# Patient Record
Sex: Male | Born: 1950 | Race: White | Hispanic: No | Marital: Married | Smoking: Former smoker
Health system: Southern US, Community
[De-identification: ages and names within clinical notes are randomized; demographics above are authoritative.]

## PROBLEM LIST (undated history)

## (undated) DIAGNOSIS — M1990 Unspecified osteoarthritis, unspecified site: Secondary | ICD-10-CM

## (undated) DIAGNOSIS — K5792 Diverticulitis of intestine, part unspecified, without perforation or abscess without bleeding: Secondary | ICD-10-CM

## (undated) DIAGNOSIS — I739 Peripheral vascular disease, unspecified: Secondary | ICD-10-CM

## (undated) HISTORY — PX: LUMBAR FUSION: SHX111

## (undated) HISTORY — PX: FRACTURE SURGERY: SHX138

## (undated) HISTORY — PX: HERNIA REPAIR: SHX51

## (undated) HISTORY — PX: MENISCUS REPAIR: SHX5179

## (undated) HISTORY — PX: LUMBAR DISC SURGERY: SHX700

## (undated) HISTORY — PX: COLONOSCOPY: SHX174

---

## 2000-09-23 ENCOUNTER — Ambulatory Visit (HOSPITAL_COMMUNITY): Admission: RE | Admit: 2000-09-23 | Discharge: 2000-09-23 | Payer: Self-pay | Admitting: Neurosurgery

## 2000-09-23 ENCOUNTER — Encounter: Payer: Self-pay | Admitting: Neurosurgery

## 2000-10-05 ENCOUNTER — Encounter: Payer: Self-pay | Admitting: Neurosurgery

## 2000-10-05 ENCOUNTER — Inpatient Hospital Stay (HOSPITAL_COMMUNITY): Admission: RE | Admit: 2000-10-05 | Discharge: 2000-10-07 | Payer: Self-pay | Admitting: Neurosurgery

## 2000-10-23 ENCOUNTER — Encounter: Admission: RE | Admit: 2000-10-23 | Discharge: 2000-10-23 | Payer: Self-pay | Admitting: Neurosurgery

## 2000-10-23 ENCOUNTER — Encounter: Payer: Self-pay | Admitting: Neurosurgery

## 2003-12-22 ENCOUNTER — Emergency Department (HOSPITAL_COMMUNITY): Admission: EM | Admit: 2003-12-22 | Discharge: 2003-12-22 | Payer: Self-pay | Admitting: Emergency Medicine

## 2004-01-02 ENCOUNTER — Emergency Department (HOSPITAL_COMMUNITY): Admission: EM | Admit: 2004-01-02 | Discharge: 2004-01-02 | Payer: Self-pay | Admitting: *Deleted

## 2004-06-10 ENCOUNTER — Ambulatory Visit: Payer: Self-pay | Admitting: Family Medicine

## 2004-06-13 ENCOUNTER — Encounter: Admission: RE | Admit: 2004-06-13 | Discharge: 2004-06-13 | Payer: Self-pay | Admitting: Family Medicine

## 2004-06-17 ENCOUNTER — Ambulatory Visit: Payer: Self-pay | Admitting: Family Medicine

## 2005-12-13 ENCOUNTER — Encounter: Admission: RE | Admit: 2005-12-13 | Discharge: 2005-12-13 | Payer: Self-pay | Admitting: Neurosurgery

## 2005-12-22 ENCOUNTER — Encounter: Admission: RE | Admit: 2005-12-22 | Discharge: 2005-12-22 | Payer: Self-pay | Admitting: Neurosurgery

## 2006-01-12 ENCOUNTER — Encounter: Admission: RE | Admit: 2006-01-12 | Discharge: 2006-01-12 | Payer: Self-pay | Admitting: Neurosurgery

## 2008-02-12 ENCOUNTER — Encounter: Admission: RE | Admit: 2008-02-12 | Discharge: 2008-02-12 | Payer: Self-pay | Admitting: Neurosurgery

## 2008-04-22 ENCOUNTER — Inpatient Hospital Stay (HOSPITAL_COMMUNITY): Admission: RE | Admit: 2008-04-22 | Discharge: 2008-04-22 | Payer: Self-pay | Admitting: Neurosurgery

## 2010-08-21 ENCOUNTER — Encounter: Payer: Self-pay | Admitting: Neurosurgery

## 2010-12-13 NOTE — H&P (Signed)
Joseph Melton, BIGGAR NO.:  [id number]   MEDICAL RECORD NO.:  [id number]          PATIENT TYPE:  INP   LOCATION:  3172                         FACILITY:  [facility]   PHYSICIAN:  Payton Doughty, M.D.      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   This is a 60 year old right-handed white gentleman who has had several  fusions in his back, had spondylolysis, and then was decompressed by Dr.  Roxan Hockey numerous years ago, developed increased slip.  I had fused him  6 or 7 years ago from L3-S1, been doing reasonably well, and over the  past few months developed increased pain in his back and down his leg.  Films show stenosis at L2-3 with some transitional segment disease.  He  tried an epidural steroid injection, did not help him very much.  He now  presents for a simple decompression, L2-3.   Medical history is very benign.   He stopped smoking, does not drink.  He is an Nutritional therapist.  The  only operations have been his back.   MEDICATIONS:  Occasional ibuprofen.   Allergies are none.   Family history is not germane.   REVIEW OF SYSTEMS:  Marked for back and leg pain.   PHYSICAL EXAMINATION:  HEENT:  Normal limits.  NECK:  He has reasonable range of motion in his neck.  CHEST:  Clear.  CARDIAC:  Regular rate and rhythm.  ABDOMEN:  Nontender.  No hepatosplenomegaly.  EXTREMITIES:  Without clubbing or cyanosis.  Peripheral pulses are good.  GU:  Deferred.  NEUROLOGIC:  He is awake, alert, and oriented.  Cranial nerves are  intact.  Motor exam shows 5/5 strength throughout the upper and lower  extremities.  The biggest problem is when he stands up, he develops pain  down both legs and his legs get heavy after he has been up for a bit.  Reflexes are absent in the knees and ankles.  Toes are downgoing  bilaterally.  Straight  leg is negative.   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   The plan is for bilateral laminotomy and foraminotomy.  This is in lieu  of extending his fusion.  The risks and benefits of this have been  discussed with him and he wished to proceed.           ______________________________  Payton Doughty, M.D.     MWR/MEDQ  D:  04/22/2008  T:  04/22/2008  Job:  [id number]

## 2010-12-13 NOTE — Op Note (Signed)
NAMEORMAN, MATSUMURA NO.:  [id number]   MEDICAL RECORD NO.:  [id number]          PATIENT TYPE:  INP   LOCATION:  NA                           FACILITY:  [facility]   PHYSICIAN:  Payton Doughty, M.D.      DATE OF BIRTH:  12/07/1950   DATE OF PROCEDURE:  04/22/2008  DATE OF DISCHARGE:                               OPERATIVE REPORT   PREOPERATIVE DIAGNOSIS:  Spondylosis L2-L3.   POSTOPERATIVE DIAGNOSIS:  Spondylosis L2-L3.   PROCEDURE:  L2-L3 laminotomy and foraminotomy done bilaterally.   DICTATING DOCTOR:  Payton Doughty, MD   ANESTHESIA:  General endotracheal.   PREPARATION:  Prepped and draped with alcohol wipe.   COMPLICATIONS:  None.   BODY OF TEXT:  This is a 60 year old gentleman with fusion from L3-S1,  now with spondylosis at L2-L3, taken to the operating room, smoothly  anesthetized and intubated, placed prone on the operating table.  Following shave, prep, and drape in the usual sterile fashion, skin was  infiltrated with 1% lidocaine with 1:400,000 epinephrine.  The skin was  incised over the lamina of L2.  It was dissected free in the  subperiosteal plane. Intraoperative x-ray confirmed correctness level.  Having confirmed correctness level, laminotomy and foraminotomy was  carried to the top of the ligamentum flavum that was removed in a  retrograde fashion.  Great care was taken to not violate the pars  radicularis and cause destabilization.  Decompression was carried out  bilaterally.  Both the L2 and L3 roots were visualized and explored out  of the neuroforamen.  Following complete decompression, the wound was  irrigated, and hemostasis assured.  Depo-Medrol-soaked pad was placed in  the laminotomy defect.  Successive layers of 0-Vicryl, 2-0 Vicryl, and 3-  0 nylon were used to close.  Betadine and Telfa dressing was applied and  made occlusive with OpSite.  The patient returned to the recovery room  in good condition.     ______________________________  Payton Doughty, M.D.     MWR/MEDQ  D:  04/22/2008  T:  04/23/2008  Job:  [id number]

## 2010-12-16 NOTE — Op Note (Signed)
[place]  Patient:    Joseph Melton, Joseph Melton                     MRN: [id number] Proc. Date: 10/05/00 Adm. 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  PROCEDURES:  L3-4, L4-5 laminectomy, diskectomy, and posterior lumbar interbody fusion with the Ray Threaded Fusion Cage, and L5-S1 Gill procedure with L5-S1 pedicle fixation with spiral 90D pedicle screws and collagen bone marrow aspirate bone grafting.  SURGEON:  Payton Doughty, M.D.  ANESTHESIA:  General endotracheal.  PREPARATION:  Sterile Betadine prep and scrub with alcohol wipe.  COMPLICATIONS:  None.  CLINICAL NOTE:  A 60 year old gentleman with severe spondylitic disease, spondylolysis at L5, with a grade 1 slip of L5 on S1 and recurrent disk at L3-4 on the right.  DESCRIPTION OF PROCEDURE:  Patient taken to the operating room and smoothly incised and intubated, placed prone on the operating table.  Following shave, prep, and drape in the usual sterile fashion, the skin was infiltrated with 1% lidocaine with 1:400,000 epinephrine.  Skin was incised from mid-S1 to mid-L2, and the laminae of L3, L4, L5, and S1 were exposed bilaterally in the subperiosteal plane, and the transverse process of L5 and the sacral ala were exposed bilaterally.  Intraoperative x-ray confirmed correctness of level. The lamina, pars interarticularis, and inferior facets of L3 and L4 and the superior facet of L4 and L5 were removed bilaterally.  On the right side, there was a recurrent herniated disk at 3-4 that was removed without difficulty.  The 3, 4, and 5 roots were dissected free of their _____  of respective pedicles.  Ligamentum flavum was removed.  Diskectomy was carried out at each level, and Ray Threaded Fusion Cages were placed, 14 x 26 mm cages at 4-5 and 12 x 26 mm cages at 3-4.  Intraoperative x-ray confirmed that we had good placement of the cages.  They were packed with bone graft harvested from the facet joints and capped.  The wound was irrigated and hemostasis assured.  The lamina and inferior facet of L5 was then removed as two pieces, leaving the interspinous ligament and spinous process as a tension band.  The L5 root was then demonstrated coming off in a conjoined fashion on the right side.  It was extremely stretched across the spondylolisthesis at L5 on S1. Because of this anatomy, it was not possible to place Ray cages in an interbody fashion at 5-1.  The nerve itself was quite stretched, very thin, and looked quite unhealthy.  Similar findings were manifest on the left side, except the nerve appeared to be a bit more robust, although it was quite stretched.  The pedicle screws were then placed using standard trajectory at both L5 and in the S1.  Intraoperative x-ray showed good placement of pedicle screws.  Forty millimeter rods were then placed across and then locked into place.  Bone marrow was aspirated from the sacrum through the pedicle hole and also through L5 through the pedicle hole and placed on the collagen matrix.  This was placed in the intertransverse position as well as between the pedicles of L5 and S1.  These had previously been decorticated with the high-speed drill.  This was augmented with bone that was taken from the facet joints and tamped into place.  The fascia was then reapproximated with 0 Vicryl in interrupted fashion, subcutaneous tissue was reapproximated with 0 Vicryl in interrupted fashion, subcuticular tissue was reapproximated with 3-0 Vicryl in interrupted fashion.  The skin was closed with 3-0 nylon in a running locked fashion.   Betadine and Telfa dressing was applied and made occlusive with OpSite.  The patient was then returned to the recovery room in good condition. DD:  10/05/00 TD:  10/06/00 Job: [id number]

## 2010-12-16 NOTE — H&P (Signed)
. [facility]  Patient:    Joseph Melton, Joseph Melton                       MRN: [id number] Adm. Date:  10/05/00 Attending:  Payton Doughty, M.D.                         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  HISTORY OF PRESENT ILLNESS:  This is a now 60 year old right-handed white gentleman who in 1995 underwent a herniated disc at L3-4 on the right side by Dr. Corlis Hove.  He did well postoperatively.  At that time he was noted to have spondylolysis and mild spondylolisthesis of L5 on S1.  Starting about two months ago, he had had increasing pain in his back and now toward his right hip, much worse over the past couple of weeks.  He has discomfort down into his shin and cannot get comfortable in any position.  MRI plane films revealed increased lipid L5-S1 as well as scarring of what I believe to be recurrent disc at L3-4 on the right side.  He is now admitted for a laminectomy discectomy infusion at L3-4, L4-5 and L5-S1.  MEDICAL HISTORY:  Is really unremarkable.  MEDICATIONS:  He takes no medications.  OPERATIONS:  His only operations have been his back operation.  ALLERGIES:  He has no allergies.  FAMILY HISTORY:  Mom is 35 and in good health.  Dad is 52 and in good health. They both had lumbar spine operations.  SOCIAL HISTORY:  Smokes a pack of cigarettes occasionally, drinks alcohol socially and is remodeling a golf course.  REVIEW OF SYSTEMS:  Remarkable for back pain and leg pain, does not have any other problems or any other complaints and there are no bladder complaints. HEENT exam was within normal limits.  He has good range of motion in his neck. Chest is clear.  Cardiac exam is regular rate and rhythm without murmur. Abdomen is nontender with no hepatosplenomegaly.  Extremities without clubbing or cyanosis.  GU  exam was deferred.  Peripheral pulses are good. Neurologically he is awake, alert and oriented.  Pupils are equal, round and reactive to light.  His extraocular movements are intact.  Facial movement and sensation are intact.  Tongue is in the midline and he describes no swallowing difficulty.  Shoulder shrug is normal.  Motor exam is 5/5 strength throughout the upper and lower extremities and there is no current sensory deficit.  In the upper extremities and lower extremities, he has numbness on the soles of both feet.  Straight leg raise was not positive previously, is now positive on the right side.  Deep tendon reflexes are 2 at the knees, absent at the ankles bilaterally.  He can bend forward with his back to about 10 degrees before it hurts then stops for awhile, then hurts again at about 90 degrees and is uncomfortable all the way through recovery ______ and into extension.  He comes in with plane films that show spondylolysis of L5 with a grade 2 ______ of L5-S1.  MRI confirms these findings, demonstrates a pseudo disc at 5-1 with biforaminal narrowing compressing the 5 ridge, severe spondylosis at 4-5 and probable recurrent disc at 3-4 on the right side.  CLINICAL IMPRESSION:  Spondylolysis of L5 with grade 1 spondylolisthesis of  L5 and S1, spondylosis of L4-5 and recurrent herniated disc at L3-4.  The plan is for a lumbar laminectomy, discectomy, posterior lumbar by fusion at 3-4, 4-5, and 5-1. I think 3-4 needs to be done and 5-1 certainly needs to be done and cannot leave 4-5 as a floating level.  The risks and benefits of this approach have been discussed with him and he wishes to proceed. DD:  10/05/00 TD:  10/05/00 Job: [id number]

## 2011-03-30 ENCOUNTER — Other Ambulatory Visit: Payer: Self-pay | Admitting: Internal Medicine

## 2011-03-30 DIAGNOSIS — K5792 Diverticulitis of intestine, part unspecified, without perforation or abscess without bleeding: Secondary | ICD-10-CM

## 2011-03-30 DIAGNOSIS — R1032 Left lower quadrant pain: Secondary | ICD-10-CM

## 2011-03-31 ENCOUNTER — Other Ambulatory Visit: Payer: Self-pay

## 2011-05-01 LAB — DIFFERENTIAL
Basophils Relative: 1
Eosinophils Relative: 2
Monocytes Absolute: 1.1 — ABNORMAL HIGH
Neutro Abs: 5.7

## 2011-05-01 LAB — URINALYSIS, ROUTINE W REFLEX MICROSCOPIC
Glucose, UA: NEGATIVE
Nitrite: NEGATIVE
Urobilinogen, UA: 0.2
pH: 5

## 2011-05-01 LAB — COMPREHENSIVE METABOLIC PANEL
ALT: 15
Alkaline Phosphatase: 63
CO2: 25
Calcium: 9.6
GFR calc Af Amer: >60
Glucose, Bld: 104 — ABNORMAL HIGH
Potassium: 4.5
Sodium: 140
Total Bilirubin: 0.8
Total Protein: 6.6

## 2011-05-01 LAB — PROTIME-INR: Prothrombin Time: 12.4

## 2011-05-01 LAB — CBC
Hemoglobin: 16.3
MCHC: 33.9
RBC: 5.2
RDW: 13.8
WBC: 9.7

## 2011-05-01 LAB — URINE MICROSCOPIC-ADD ON

## 2011-05-01 LAB — APTT: aPTT: 31

## 2012-06-05 ENCOUNTER — Other Ambulatory Visit: Payer: Self-pay | Admitting: Family Medicine

## 2012-06-05 DIAGNOSIS — D72829 Elevated white blood cell count, unspecified: Secondary | ICD-10-CM

## 2012-06-05 DIAGNOSIS — R1032 Left lower quadrant pain: Secondary | ICD-10-CM

## 2012-06-05 DIAGNOSIS — R319 Hematuria, unspecified: Secondary | ICD-10-CM

## 2012-06-07 ENCOUNTER — Ambulatory Visit
Admission: RE | Admit: 2012-06-07 | Discharge: 2012-06-07 | Disposition: A | Discharge status: Home / Self Care | Payer: BC Managed Care – PPO | Source: Ambulatory Visit | Attending: Family Medicine | Admitting: Family Medicine

## 2012-06-07 DIAGNOSIS — R319 Hematuria, unspecified: Secondary | ICD-10-CM

## 2012-06-07 DIAGNOSIS — D72829 Elevated white blood cell count, unspecified: Secondary | ICD-10-CM

## 2012-06-07 DIAGNOSIS — R1032 Left lower quadrant pain: Secondary | ICD-10-CM

## 2012-06-07 MED ORDER — IOHEXOL 300 MG/ML  SOLN
100.0000 mL | Freq: Once | INTRAMUSCULAR | Status: AC | PRN
  Administered 2012-06-07: 100 mL via INTRAVENOUS

## 2013-09-22 ENCOUNTER — Ambulatory Visit (INDEPENDENT_AMBULATORY_CARE_PROVIDER_SITE_OTHER): Payer: BC Managed Care – PPO | Admitting: Physician Assistant

## 2013-09-22 ENCOUNTER — Encounter: Payer: Self-pay | Admitting: Physician Assistant

## 2013-09-22 VITALS — BP 122/86 | HR 68 | Temp 98.0°F | Resp 18 | Ht 68.5 in | Wt 207.0 lb

## 2013-09-22 DIAGNOSIS — H8110 Benign paroxysmal vertigo, unspecified ear: Secondary | ICD-10-CM

## 2013-09-22 MED ORDER — MECLIZINE HCL 25 MG PO TABS: 25.0000 mg | ORAL_TABLET | Freq: Three times a day (TID) | ORAL | Status: DC | PRN

## 2013-09-22 NOTE — Progress Notes (Signed)
    Patient ID: Joseph Melton MRN: [id number], DOB: 12/05/1950, 63 y.o. Date of Encounter: 09/22/2013, 1:39 PM    Chief Complaint:  Chief Complaint  Patient presents with  . dizzy    x 3 days     HPI: 63 y.o. year old white male reports that he has no significant past medical history other than diverticulitis. Takes no medication on a daily basis except for Zantac.  Says that he has been having these symptoms for the past 3 days. Whenever he has a change in position or sits down or first stands up, he has a spinning sensation. When he walks he feels like he is drunk. Says that whenever he initially has a position change is when he notices the  spinning but once he stays in a certain position the spinning stops.. Says this has never  happened to him before. Has had no recent viral illness or cold like symptoms.     Home Meds: See attached medication section for any medications that were entered at today's visit. The computer does not put those onto this list.The following list is a list of meds entered prior to today's visit.   No current outpatient prescriptions on file prior to visit.   No current facility-administered medications on file prior to visit.    Allergies: No Known Allergies    Review of Systems: See HPI for pertinent ROS. All other ROS negative.    Physical Exam: Blood pressure 122/86, pulse 68, temperature 98 F (36.7 C), temperature source Oral, resp. rate 18, height 5' 8.5" (1.74 m), weight 207 lb (93.895 kg)., Body mass index is 31.01 kg/(m^2). General: WNWD WM.  Appears in no acute distress. HEENT: Normocephalic, atraumatic, eyes without discharge, sclera non-icteric, nares are without discharge. Bilateral auditory canals clear, TM's are without perforation, pearly grey and translucent with reflective cone of light bilaterally. Neck: Supple. No thyromegaly. No lymphadenopathy. Lungs: Clear bilaterally to auscultation without wheezes, rales, or rhonchi.  Breathing is unlabored. Heart: Regular rhythm. No murmurs, rubs, or gallops. Dix-Hallpike maneuver: Positive. When  turned his head to each side, he develops vertigo when turned to both the right and the left. Msk:  Strength and tone normal for age. Neuro: Alert and oriented X 3. Moves all extremities spontaneously. Gait is normal. CNII-XII grossly in tact. Psych:  Responds to questions appropriately with a normal affect.     ASSESSMENT AND PLAN:  63 y.o. year old male with  1. Benign paroxysmal positional vertigo - meclizine (ANTIVERT) 25 MG tablet; Take 1 tablet (25 mg total) by mouth 3 (three) times daily as needed for dizziness.  Dispense: 30 tablet; Refill: 0 - Ambulatory referral to ENT He states that one week from today he leaves to go out of town for work and will be gone for one month. I discussed this with our staff to try to get he and into the ENT this week. ENT can do Epley maneuvers and teach him how to do these. As well I went ahead and printed out Epley maneuvers for him so that he can start doing these at home as well.   [address]igned, Mary Beth CobreDixon, [place], [facility] 09/22/2013 1:39 PM

## 2017-10-29 ENCOUNTER — Other Ambulatory Visit: Payer: Self-pay | Admitting: Orthopedic Surgery

## 2017-10-30 NOTE — Pre-Procedure Instructions (Signed)
Joseph ScarletRodney R Melton  10/30/2017    Your procedure is scheduled on Tuesday, November 06, 2017 at 7:30 AM.   [facility] at 5:30 AM.   Call this number if you have problems the morning of surgery: [phone]   Questions prior to day of surgery, please call [phone] between 8 & 4 PM.   Remember:  Do not eat food or drink liquids after midnight Monday, 11/05/17.   Stop NSAIDS (Ibuprofen, Aleve, etc) as of today. Do not use Aspirin Products prior to surgery.   Do not wear jewelry.  Do not wear lotions, powders, cologne or deodorant.  Men may shave face and neck.  Do not bring valuables to the hospital.  [facility] is not responsible for any belongings or valuables.  Contacts, dentures or bridgework may not be worn into surgery.  Leave your suitcase in the car.  After surgery it may be brought to your room.  For patients admitted to the hospital, discharge time will be determined by your treatment team.  Patients discharged the day of surgery will not be allowed to drive home.   [place] - Preparing for Surgery  Before surgery, you can play an important role.  Because skin is not sterile, your skin needs to be as free of germs as possible.  You can reduce the number of germs on you skin by washing with CHG (chlorahexidine gluconate) soap before surgery.  CHG is an antiseptic cleaner which kills germs and bonds with the skin to continue killing germs even after washing.  Please DO NOT use if you have an allergy to CHG or antibacterial soaps.  If your skin becomes reddened/irritated stop using the CHG and inform your nurse when you arrive at Short Stay.  Do not shave (including legs and underarms) for at least 48 hours prior to the first CHG shower.  You may shave your face.  Please follow these instructions carefully:   1.  Shower with CHG Soap the night before surgery and the                    morning of Surgery.  2.  If you choose  to wash your hair, wash your hair first as usual with your       normal shampoo.  3.  After you shampoo, rinse your hair and body thoroughly to remove the shampoo.  4.  Use CHG as you would any other liquid soap.  You can apply chg directly       to the skin and wash gently with scrungie or a clean washcloth.  5.  Apply the CHG Soap to your body ONLY FROM THE NECK DOWN.        Do not use on open wounds or open sores.  Avoid contact with your eyes, ears, mouth and genitals (private parts).  Wash genitals (private parts) with your normal soap.  6.  Wash thoroughly, paying special attention to the area where your surgery        will be performed.  7.  Thoroughly rinse your body with warm water from the neck down.  8.  DO NOT shower/wash with your normal soap after using and rinsing off       the CHG Soap.  9.  Pat yourself dry with a clean towel.            10.  Wear clean pajamas.  11.  Place clean sheets on your bed the night of your first shower and do not        sleep with pets.  Day of Surgery  Do not apply any lotions/deodorants the morning of surgery.  Please wear clean clothes to the hospital.   Please read over the fact sheets that you were given.

## 2017-10-31 ENCOUNTER — Other Ambulatory Visit: Payer: Self-pay

## 2017-10-31 ENCOUNTER — Encounter (HOSPITAL_COMMUNITY)
Admission: RE | Admit: 2017-10-31 | Discharge: 2017-10-31 | Disposition: A | Discharge status: Home / Self Care | Payer: Medicare Other | Source: Ambulatory Visit | Attending: Obstetrics & Gynecology | Admitting: Obstetrics & Gynecology

## 2017-10-31 ENCOUNTER — Encounter (HOSPITAL_COMMUNITY): Payer: Self-pay

## 2017-10-31 DIAGNOSIS — Z01812 Encounter for preprocedural laboratory examination: Secondary | ICD-10-CM | POA: Diagnosis not present

## 2017-10-31 HISTORY — DX: Unspecified osteoarthritis, unspecified site: M19.90

## 2017-10-31 HISTORY — DX: Diverticulitis of intestine, part unspecified, without perforation or abscess without bleeding: K57.92

## 2017-10-31 LAB — CBC
HCT: 45.3 % (ref 39.0–52.0)
Hemoglobin: 14.6 g/dL (ref 13.0–17.0)
MCH: 30.2 pg (ref 26.0–34.0)
MCHC: 32.2 g/dL (ref 30.0–36.0)
MCV: 93.6 fL (ref 78.0–100.0)
Platelets: 201 10*3/uL (ref 150–400)
RBC: 4.84 MIL/uL (ref 4.22–5.81)
RDW: 13.7 % (ref 11.5–15.5)
WBC: 7.7 10*3/uL (ref 4.0–10.5)

## 2017-10-31 NOTE — Progress Notes (Signed)
Pt denies cardiac history, HTN, or diabetes.

## 2017-11-06 ENCOUNTER — Ambulatory Visit (HOSPITAL_COMMUNITY): Payer: Medicare Other | Admitting: Emergency Medicine

## 2017-11-06 ENCOUNTER — Encounter (HOSPITAL_COMMUNITY)
Admission: RE | Disposition: A | Discharge status: Home / Self Care | Payer: Self-pay | Source: Ambulatory Visit | Attending: Orthopedic Surgery

## 2017-11-06 ENCOUNTER — Encounter (HOSPITAL_COMMUNITY): Payer: Self-pay | Admitting: *Deleted

## 2017-11-06 ENCOUNTER — Ambulatory Visit (HOSPITAL_COMMUNITY)
Admission: RE | Admit: 2017-11-06 | Discharge: 2017-11-06 | Disposition: A | Discharge status: Home / Self Care | Payer: Medicare Other | Source: Ambulatory Visit | Attending: Orthopedic Surgery | Admitting: Orthopedic Surgery

## 2017-11-06 DIAGNOSIS — Z981 Arthrodesis status: Secondary | ICD-10-CM | POA: Insufficient documentation

## 2017-11-06 DIAGNOSIS — W1830XA Fall on same level, unspecified, initial encounter: Secondary | ICD-10-CM | POA: Diagnosis not present

## 2017-11-06 DIAGNOSIS — Z87891 Personal history of nicotine dependence: Secondary | ICD-10-CM | POA: Diagnosis not present

## 2017-11-06 DIAGNOSIS — Z791 Long term (current) use of non-steroidal anti-inflammatories (NSAID): Secondary | ICD-10-CM | POA: Diagnosis not present

## 2017-11-06 DIAGNOSIS — S46012A Strain of muscle(s) and tendon(s) of the rotator cuff of left shoulder, initial encounter: Secondary | ICD-10-CM | POA: Diagnosis not present

## 2017-11-06 HISTORY — PX: SHOULDER ARTHROSCOPY WITH ROTATOR CUFF REPAIR AND SUBACROMIAL DECOMPRESSION: SHX5686

## 2017-11-06 SURGERY — SHOULDER ARTHROSCOPY WITH ROTATOR CUFF REPAIR AND SUBACROMIAL DECOMPRESSION
Anesthesia: General | Site: Shoulder | Laterality: Left

## 2017-11-06 MED ORDER — PHENYLEPHRINE HCL 10 MG/ML IJ SOLN
INTRAMUSCULAR | Status: DC | PRN
  Administered 2017-11-06: 25 ug/min via INTRAVENOUS

## 2017-11-06 MED ORDER — PROPOFOL 10 MG/ML IV BOLUS
INTRAVENOUS | Status: DC | PRN
  Administered 2017-11-06: 150 mg via INTRAVENOUS

## 2017-11-06 MED ORDER — FENTANYL CITRATE (PF) 250 MCG/5ML IJ SOLN
INTRAMUSCULAR | Status: AC
  Filled 2017-11-06: qty 5

## 2017-11-06 MED ORDER — ROCURONIUM BROMIDE 100 MG/10ML IV SOLN
INTRAVENOUS | Status: DC | PRN
  Administered 2017-11-06: 50 mg via INTRAVENOUS

## 2017-11-06 MED ORDER — SUGAMMADEX SODIUM 200 MG/2ML IV SOLN
INTRAVENOUS | Status: AC
  Filled 2017-11-06: qty 2

## 2017-11-06 MED ORDER — LIDOCAINE 2% (20 MG/ML) 5 ML SYRINGE
INTRAMUSCULAR | Status: AC
  Filled 2017-11-06: qty 5

## 2017-11-06 MED ORDER — SODIUM CHLORIDE 0.9 % IR SOLN
Status: DC | PRN
  Administered 2017-11-06: 3000 mL

## 2017-11-06 MED ORDER — STERILE WATER FOR IRRIGATION IR SOLN
Status: DC | PRN
  Administered 2017-11-06: 1000 mL

## 2017-11-06 MED ORDER — CEFAZOLIN SODIUM-DEXTROSE 2-4 GM/100ML-% IV SOLN
2.0000 g | INTRAVENOUS | Status: AC
  Administered 2017-11-06: 2 g via INTRAVENOUS
  Filled 2017-11-06: qty 100

## 2017-11-06 MED ORDER — OXYCODONE-ACETAMINOPHEN 5-325 MG PO TABS: 1.0000 | ORAL_TABLET | ORAL | 0 refills | Status: DC | PRN

## 2017-11-06 MED ORDER — ONDANSETRON HCL 4 MG/2ML IJ SOLN
INTRAMUSCULAR | Status: DC | PRN
  Administered 2017-11-06: 4 mg via INTRAVENOUS

## 2017-11-06 MED ORDER — ONDANSETRON HCL 4 MG/2ML IJ SOLN: 4.0000 mg | Freq: Four times a day (QID) | INTRAMUSCULAR | Status: DC | PRN

## 2017-11-06 MED ORDER — DEXAMETHASONE SODIUM PHOSPHATE 10 MG/ML IJ SOLN
INTRAMUSCULAR | Status: DC | PRN
  Administered 2017-11-06: 10 mg via INTRAVENOUS

## 2017-11-06 MED ORDER — FENTANYL CITRATE (PF) 100 MCG/2ML IJ SOLN
INTRAMUSCULAR | Status: DC | PRN
  Administered 2017-11-06 (×2): 50 ug via INTRAVENOUS

## 2017-11-06 MED ORDER — BUPIVACAINE HCL (PF) 0.5 % IJ SOLN
INTRAMUSCULAR | Status: DC | PRN
  Administered 2017-11-06: 15 mL via PERINEURAL

## 2017-11-06 MED ORDER — ROCURONIUM BROMIDE 10 MG/ML (PF) SYRINGE
PREFILLED_SYRINGE | INTRAVENOUS | Status: AC
  Filled 2017-11-06: qty 5

## 2017-11-06 MED ORDER — MIDAZOLAM HCL 5 MG/5ML IJ SOLN
INTRAMUSCULAR | Status: DC | PRN
  Administered 2017-11-06: 2 mg via INTRAVENOUS

## 2017-11-06 MED ORDER — DEXAMETHASONE SODIUM PHOSPHATE 10 MG/ML IJ SOLN
INTRAMUSCULAR | Status: AC
  Filled 2017-11-06: qty 1

## 2017-11-06 MED ORDER — SUGAMMADEX SODIUM 200 MG/2ML IV SOLN
INTRAVENOUS | Status: DC | PRN
  Administered 2017-11-06: 200 mg via INTRAVENOUS

## 2017-11-06 MED ORDER — ONDANSETRON HCL 4 MG/2ML IJ SOLN
INTRAMUSCULAR | Status: AC
  Filled 2017-11-06: qty 2

## 2017-11-06 MED ORDER — MIDAZOLAM HCL 2 MG/2ML IJ SOLN
INTRAMUSCULAR | Status: AC
  Filled 2017-11-06: qty 2

## 2017-11-06 MED ORDER — LIDOCAINE HCL (CARDIAC) 20 MG/ML IV SOLN
INTRAVENOUS | Status: DC | PRN
  Administered 2017-11-06: 50 mg via INTRAVENOUS

## 2017-11-06 MED ORDER — FENTANYL CITRATE (PF) 100 MCG/2ML IJ SOLN: 25.0000 ug | INTRAMUSCULAR | Status: DC | PRN

## 2017-11-06 MED ORDER — METHOCARBAMOL 500 MG PO TABS: 500.0000 mg | ORAL_TABLET | Freq: Three times a day (TID) | ORAL | 0 refills | Status: DC

## 2017-11-06 MED ORDER — LACTATED RINGERS IV SOLN
INTRAVENOUS | Status: DC | PRN
  Administered 2017-11-06: 07:00:00 via INTRAVENOUS

## 2017-11-06 MED ORDER — OXYCODONE HCL 5 MG/5ML PO SOLN: 5.0000 mg | Freq: Once | ORAL | Status: DC | PRN

## 2017-11-06 MED ORDER — BUPIVACAINE LIPOSOME 1.3 % IJ SUSP
INTRAMUSCULAR | Status: DC | PRN
  Administered 2017-11-06: 10 mL via PERINEURAL

## 2017-11-06 MED ORDER — POVIDONE-IODINE 7.5 % EX SOLN
Freq: Once | CUTANEOUS | Status: DC
  Filled 2017-11-06: qty 118

## 2017-11-06 MED ORDER — PROPOFOL 10 MG/ML IV BOLUS
INTRAVENOUS | Status: AC
  Filled 2017-11-06: qty 20

## 2017-11-06 MED ORDER — OXYCODONE HCL 5 MG PO TABS: 5.0000 mg | ORAL_TABLET | Freq: Once | ORAL | Status: DC | PRN

## 2017-11-06 SURGICAL SUPPLY — 66 items
ANCH SUT SWLK 19.1X4.75 VT (Anchor) ×4 IMPLANT
ANCHOR PEEK 4.75X19.1 SWLK C (Anchor) ×8 IMPLANT
BLADE SURG 11 STRL SS (BLADE) ×3 IMPLANT
BUR OVAL 4.0 (BURR) ×3 IMPLANT
CANNULA 5.75X71 LONG (CANNULA) ×3 IMPLANT
CANNULA TWIST IN 8.25X7CM (CANNULA) IMPLANT
CHLORAPREP W/TINT 26ML (MISCELLANEOUS) ×3 IMPLANT
DRAPE INCISE IOBAN 66X45 STRL (DRAPES) ×3 IMPLANT
DRAPE ORTHO SPLIT 77X108 STRL (DRAPES) ×6
DRAPE STERI 35X30 U-POUCH (DRAPES) ×3 IMPLANT
DRAPE SURG 17X23 STRL (DRAPES) ×3 IMPLANT
DRAPE SURG ORHT 6 SPLT 77X108 (DRAPES) ×2 IMPLANT
DRAPE U-SHAPE 47X51 STRL (DRAPES) ×3 IMPLANT
DRAPE U-SHAPE 76X120 STRL (DRAPES) ×3 IMPLANT
DRSG PAD ABDOMINAL 8X10 ST (GAUZE/BANDAGES/DRESSINGS) ×6 IMPLANT
GAUZE SPONGE 4X4 12PLY STRL (GAUZE/BANDAGES/DRESSINGS) ×3 IMPLANT
GAUZE XEROFORM 1X8 LF (GAUZE/BANDAGES/DRESSINGS) ×3 IMPLANT
GLOVE BIO SURGEON STRL SZ7 (GLOVE) ×6 IMPLANT
GLOVE BIO SURGEON STRL SZ7.5 (GLOVE) ×3 IMPLANT
GLOVE BIOGEL PI IND STRL 7.0 (GLOVE) ×2 IMPLANT
GLOVE BIOGEL PI IND STRL 8 (GLOVE) ×1 IMPLANT
GLOVE BIOGEL PI INDICATOR 7.0 (GLOVE) ×4
GLOVE BIOGEL PI INDICATOR 8 (GLOVE) ×2
GOWN STRL REUS W/ TWL LRG LVL3 (GOWN DISPOSABLE) ×3 IMPLANT
GOWN STRL REUS W/ TWL XL LVL3 (GOWN DISPOSABLE) ×1 IMPLANT
GOWN STRL REUS W/TWL LRG LVL3 (GOWN DISPOSABLE) ×9
GOWN STRL REUS W/TWL XL LVL3 (GOWN DISPOSABLE) ×3
KIT BASIN OR (CUSTOM PROCEDURE TRAY) ×3 IMPLANT
KIT TURNOVER KIT B (KITS) ×3 IMPLANT
MANIFOLD NEPTUNE II (INSTRUMENTS) ×3 IMPLANT
NDL HYPO 25GX1X1/2 BEV (NEEDLE) IMPLANT
NDL SCORPION MULTI FIRE (NEEDLE) IMPLANT
NDL SPNL 18GX3.5 QUINCKE PK (NEEDLE) ×1 IMPLANT
NDL SUT 6 .5 CRC .975X.05 MAYO (NEEDLE) IMPLANT
NEEDLE HYPO 25GX1X1/2 BEV (NEEDLE) IMPLANT
NEEDLE MAYO TAPER (NEEDLE)
NEEDLE SCORPION MULTI FIRE (NEEDLE) ×3 IMPLANT
NEEDLE SPNL 18GX3.5 QUINCKE PK (NEEDLE) ×3 IMPLANT
PACK SHOULDER (CUSTOM PROCEDURE TRAY) ×3 IMPLANT
PAD ABD 8X10 STRL (GAUZE/BANDAGES/DRESSINGS) ×2 IMPLANT
PAD ARMBOARD 7.5X6 YLW CONV (MISCELLANEOUS) ×6 IMPLANT
PROBE BIPOLAR ATHRO 135MM 90D (MISCELLANEOUS) ×2 IMPLANT
RESECTOR FULL RADIUS 4.2MM (BLADE) ×3 IMPLANT
SLING ARM FOAM STRAP LRG (SOFTGOODS) ×3 IMPLANT
SLING ARM FOAM STRAP MED (SOFTGOODS) IMPLANT
SPONGE LAP 4X18 X RAY DECT (DISPOSABLE) IMPLANT
SUPPORT WRAP ARM LG (MISCELLANEOUS) ×3 IMPLANT
SUT 2 FIBERLOOP 20 STRT BLUE (SUTURE)
SUT ETHILON 3 0 PS 1 (SUTURE) ×3 IMPLANT
SUT FIBERWIRE #2 38 T-5 BLUE (SUTURE) ×3
SUT TIGER TAPE 7 IN WHITE (SUTURE) IMPLANT
SUTURE 2 FIBERLOOP 20 STRT BLU (SUTURE) IMPLANT
SUTURE FIBERWR #2 38 T-5 BLUE (SUTURE) IMPLANT
SUTURE TAPE 1.3 40 TPR END (SUTURE) IMPLANT
SUTURETAPE 1.3 40 TPR END (SUTURE)
SYR CONTROL 10ML LL (SYRINGE) ×3 IMPLANT
TAPE CLOTH SURG 4X10 WHT LF (GAUZE/BANDAGES/DRESSINGS) ×2 IMPLANT
TAPE FIBER 2MM 7IN #2 BLUE (SUTURE) IMPLANT
TOWEL OR 17X24 6PK STRL BLUE (TOWEL DISPOSABLE) ×3 IMPLANT
TOWEL OR 17X26 10 PK STRL BLUE (TOWEL DISPOSABLE) ×3 IMPLANT
TOWEL OR NON WOVEN STRL DISP B (DISPOSABLE) ×3 IMPLANT
TUBE CONNECTING 12'X1/4 (SUCTIONS) ×1
TUBE CONNECTING 12X1/4 (SUCTIONS) ×2 IMPLANT
TUBING ARTHROSCOPY IRRIG 16FT (MISCELLANEOUS) ×5 IMPLANT
WAND HAND CNTRL MULTIVAC 90 (MISCELLANEOUS) ×1 IMPLANT
WATER STERILE IRR 1000ML POUR (IV SOLUTION) ×3 IMPLANT

## 2017-11-06 NOTE — Anesthesia Procedure Notes (Signed)
Procedure Name: Intubation Date/Time: 11/06/2017 7:40 AM Performed by: Kyung Rudd, CRNA Pre-anesthesia Checklist: Patient identified, Emergency Drugs available, Suction available and Patient being monitored Patient Re-evaluated:Patient Re-evaluated prior to induction Oxygen Delivery Method: Circle system utilized Preoxygenation: Pre-oxygenation with 100% oxygen Induction Type: IV induction Ventilation: Mask ventilation without difficulty Laryngoscope Size: Mac and 4 Grade View: Grade III Tube type: Oral Tube size: 7.5 mm Number of attempts: 1 Airway Equipment and Method: Stylet Placement Confirmation: positive ETCO2 and breath sounds checked- equal and bilateral Secured at: 22 cm Tube secured with: Tape Dental Injury: Teeth and Oropharynx as per pre-operative assessment

## 2017-11-06 NOTE — Anesthesia Preprocedure Evaluation (Addendum)
Anesthesia Evaluation  Patient identified by MRN, date of birth, ID band Patient awake    Reviewed: Allergy & Precautions, H&P , NPO status , Patient's Chart, lab work & pertinent test results  Airway Mallampati: II   Neck ROM: full    Dental  (+) Teeth Intact, Dental Advisory Given   Pulmonary former smoker,    breath sounds clear to auscultation       Cardiovascular negative cardio ROS   Rhythm:regular Rate:Normal     Neuro/Psych    GI/Hepatic   Endo/Other    Renal/GU      Musculoskeletal  (+) Arthritis ,   Abdominal   Peds  Hematology   Anesthesia Other Findings   Reproductive/Obstetrics                            Anesthesia Physical Anesthesia Plan  ASA: II  Anesthesia Plan: General   Post-op Pain Management:  Regional for Post-op pain   Induction: Intravenous  PONV Risk Score and Plan: 2 and Ondansetron, Dexamethasone, Midazolam and Treatment may vary due to age or medical condition  Airway Management Planned: Oral ETT  Additional Equipment:   Intra-op Plan:   Post-operative Plan: Extubation in OR  Informed Consent: I have reviewed the patients History and Physical, chart, labs and discussed the procedure including the risks, benefits and alternatives for the proposed anesthesia with the patient or authorized representative who has indicated his/her understanding and acceptance.     Plan Discussed with: CRNA, Anesthesiologist and Surgeon  Anesthesia Plan Comments:         Anesthesia Quick Evaluation

## 2017-11-06 NOTE — H&P (Signed)
Joseph MoleRodney R Melton is an 67 y.o. male.   Chief Complaint: L shoulder pain and dysfunction HPI: s/p fall with L large RCT.  Past Medical History:  Diagnosis Date  . Arthritis   . Diverticulitis     Past Surgical History:  Procedure Laterality Date  . COLONOSCOPY    . FRACTURE SURGERY Right    wrist  . HERNIA REPAIR     inguinal hernia repair x 2  . LUMBAR DISC SURGERY     x 2  . LUMBAR FUSION     L 3, 4, 5  . MENISCUS REPAIR Left     Family History  Problem Relation Age of Onset  . Dementia Mother   . Melanoma Father    Social History:  reports that he quit smoking about 4 years ago. He has never used smokeless tobacco. He reports that he drinks alcohol. He reports that he does not use drugs.  Allergies: No Known Allergies  Medications Prior to Admission  Medication Sig Dispense Refill  . diphenhydramine-acetaminophen (TYLENOL PM) 25-500 MG TABS tablet Take 1 tablet by mouth at bedtime as needed (for pain/rest).    Marland Kitchen. ibuprofen (ADVIL,MOTRIN) 200 MG tablet Take 800 mg by mouth 3 (three) times daily.    . Calcium Polycarbophil (FIBER-CAPS PO) Take by mouth. Takes 3 tabs 3 times a day    . loratadine (CLARITIN) 10 MG tablet Take 10 mg by mouth daily as needed for allergies (pt states he may take twice a month).      No results found for this or any previous visit (from the past 48 hour(s)). No results found.  Review of Systems  All other systems reviewed and are negative.   Blood pressure (!) 128/94, pulse 68, temperature 98.6 F (37 C), temperature source Oral, resp. rate 20, height 5\' 10"  (1.778 m), weight 93 kg (205 lb), SpO2 97 %. Physical Exam  Constitutional: He is oriented to person, place, and time. He appears well-developed and well-nourished.  HENT:  Head: Atraumatic.  Eyes: EOM are normal.  Cardiovascular: Intact distal pulses.  Respiratory: Effort normal.  Musculoskeletal:  LUE pain and weakness with RC testing  Neurological: He is alert and oriented  to person, place, and time.  Skin: Skin is warm and dry.  Psychiatric: He has a normal mood and affect.     Assessment/Plan Acute L large RCT Plan arthroscopic RCR/SAD Risks / benefits of surgery discussed Consent on chart  NPO for OR Preop antibiotics   Joseph LopesJustin W Rivers Gassmann, MD 11/06/2017, 7:27 AM

## 2017-11-06 NOTE — Op Note (Signed)
Procedure(s): SHOULDER ARTHROSCOPY WITH ROTATOR CUFF REPAIR AND SUBACROMIAL DECOMPRESSION Procedure Note  Fulton MoleRodney R Buffa male 67 y.o. 11/06/2017  Procedure(s) and Anesthesia Type:    * SHOULDER ARTHROSCOPY WITH ROTATOR CUFF REPAIR AND SUBACROMIAL DECOMPRESSION - General  Surgeon(s) and Role:    Jones Broom* Melynda Krzywicki, MD - Primary     Surgeon: Berline LopesJustin W Mikelle Myrick   Assistants: Damita Lackanielle Lalibert PA-C [facility](Danielle was present and scrubbed throughout the procedure and was essential in positioning, assisting with the camera and instrumentation,, and closure)  Anesthesia: General endotracheal anesthesia with preoperative interscalene block given by the attending anesthesiologist     Procedure Detail  SHOULDER ARTHROSCOPY WITH ROTATOR CUFF REPAIR AND SUBACROMIAL DECOMPRESSION  Estimated Blood Loss: Min         Drains: none  Blood Given: none         Specimens: none        Complications:  * No complications entered in OR log *         Disposition: PACU - hemodynamically stable.         Condition: stable    Procedure:   INDICATIONS FOR SURGERY: The patient is 67 y.o. male who had a fall with a large left shoulder rotator cuff tear.  Indicated for surgery to restore strength and function.    DESCRIPTION OF PROCEDURE: The patient was identified in preoperative  holding area where I personally marked the operative site after  verifying site, side, and procedure with the patient. An interscalene block was given by the attending anesthesiologist the holding area.  The patient was taken back to the operating room where general anesthesia was induced without complication and was placed in the beach-chair position with the back  elevated about 60 degrees and all extremities and head and neck carefully padded and  positioned.   The left upper extremity was then prepped and  draped in a standard sterile fashion. The appropriate time-out  procedure was carried out. The patient did  receive IV antibiotics  within 30 minutes of incision.   A small posterior portal incision was made and the arthroscope was introduced into the joint. An anterior portal was then established above the subscapularis using needle localization. Small cannula was placed anteriorly. Diagnostic arthroscopy was then carried out   He was noted to have some longitudinal split tearing of the subscapularis but no significant detachment off of the lesser tuberosity.  There was some degenerative tearing of the superior into the posterior labrum which was debrided with a shaver.  Biceps origin and biceps tendon were intact.  The supraspinatus and infraspinatus were completely torn and retracted posteriorly.  There was a few strands anteriorly remaining of the supraspinatus.  The arthroscope was then introduced into the subacromial space a standard lateral portal was established with needle localization. The shaver was used through the lateral portal to perform extensive bursectomy.   Care was noted to be an L-shaped tear with posterior medial retraction of the supraspinatus and infraspinatus.  Retraction small stitch was placed in the anterolateral corner of the supraspinatus to reduce it.  All adhesions were lysed.  The repair was then carried out by placing 2 4.75 swivel lock anchors in the medial row with one posterior and one superior preloaded with fiber tape and Tiger tape.  These 4 suture strands were passed evenly throughout the tear bringing the tendon posterior to anterior.  These were then brought over to additional 4.75 swivel lock anchors in the lateral row securing the tendon down.  The repair was watertight and without significant tension.  The coracoacromial ligament was taken down off the anterior acromion with the ArthroCare exposing a small hooked anterior acromial spur. A high-speed bur was then used through the lateral portal to take down the anterior acromial spur from lateral to medial in a  standard acromioplasty.  The acromioplasty was also viewed from the lateral portal and the bur was used as necessary to ensure that the acromion was completely flat from posterior to anterior.  The arthroscopic equipment was removed from the joint and the portals were closed with 3-0 nylon in an interrupted fashion. Sterile dressings were then applied including Xeroform 4 x 4's ABDs and tape. The patient was then allowed to awaken from general anesthesia, placed in a sling, transferred to the stretcher and taken to the recovery room in stable condition.   POSTOPERATIVE PLAN: The patient will be discharged home today and will followup in one week for suture removal and wound check.  We will follow the standard protocol.

## 2017-11-06 NOTE — Discharge Instructions (Signed)

## 2017-11-06 NOTE — Transfer of Care (Signed)
Immediate Anesthesia Transfer of Care Note  Patient: Joseph Melton  Procedure(s) Performed: SHOULDER ARTHROSCOPY WITH ROTATOR CUFF REPAIR AND SUBACROMIAL DECOMPRESSION (Left Shoulder)  Patient Location: PACU  Anesthesia Type:GA combined with regional for post-op pain  Level of Consciousness: awake, alert  and oriented  Airway & Oxygen Therapy: Patient Spontanous Breathing and Patient connected to nasal cannula oxygen  Post-op Assessment: Report given to RN, Post -op Vital signs reviewed and stable and Patient moving all extremities  Post vital signs: Reviewed and stable  Last Vitals:  Vitals Value Taken Time  BP 117/77 11/06/2017  9:36 AM  Temp 36.5 C 11/06/2017  9:36 AM  Pulse 72 11/06/2017  9:39 AM  Resp 14 11/06/2017  9:39 AM  SpO2 100 % 11/06/2017  9:39 AM  Vitals shown include unvalidated device data.  Last Pain:  Vitals:   11/06/17 0936  TempSrc:   PainSc: 0-No pain      Patients Stated Pain Goal: 5 (11/06/17 0552)  Complications: No apparent anesthesia complications

## 2017-11-06 NOTE — Anesthesia Procedure Notes (Signed)
Anesthesia Regional Block: Interscalene brachial plexus block   Pre-Anesthetic Checklist: ,, timeout performed, Correct Patient, Correct Site, Correct Laterality, Correct Procedure, Correct Position, site marked, Risks and benefits discussed,  Surgical consent,  Pre-op evaluation,  At surgeon's request and post-op pain management  Laterality: Left  Prep: chloraprep       Needles:  Injection technique: Single-shot  Needle Type: Echogenic Stimulator Needle     Needle Length: 5cm  Needle Gauge: 22     Additional Needles:   Procedures:, nerve stimulator,,,,,,,  Narrative:  Start time: 11/06/2017 7:06 AM End time: 11/06/2017 7:13 AM Injection made incrementally with aspirations every 5 mL.  Performed by: Personally  Anesthesiologist: Achille RichHodierne, Adalaya Irion, MD  Additional Notes: Functioning IV was confirmed and monitors were applied.  A 50mm 22ga Arrow echogenic stimulator needle was used. Sterile prep and drape,hand hygiene and sterile gloves were used.  Negative aspiration and negative test dose prior to incremental administration of local anesthetic. The patient tolerated the procedure well.  Ultrasound guidance: relevent anatomy identified, needle position confirmed, local anesthetic spread visualized around nerve(s), vascular puncture avoided.  Image printed for medical record.

## 2017-11-07 NOTE — Anesthesia Postprocedure Evaluation (Signed)
Anesthesia Post Note  Patient: Joseph Melton  Procedure(s) Performed: SHOULDER ARTHROSCOPY WITH ROTATOR CUFF REPAIR AND SUBACROMIAL DECOMPRESSION (Left Shoulder)     Patient location during evaluation: PACU Anesthesia Type: General Level of consciousness: awake and alert Pain management: pain level controlled Vital Signs Assessment: post-procedure vital signs reviewed and stable Respiratory status: spontaneous breathing, nonlabored ventilation, respiratory function stable and patient connected to nasal cannula oxygen Cardiovascular status: blood pressure returned to baseline and stable Postop Assessment: no apparent nausea or vomiting Anesthetic complications: no    Last Vitals:  Vitals:   11/06/17 1005 11/06/17 1030  BP: 110/84 116/83  Pulse: 64 67  Resp: 13 16  Temp: (!) 36.4 C   SpO2: 99% 98%    Last Pain:  Vitals:   11/06/17 1030  TempSrc:   PainSc: 0-No pain                 Rushawn Capshaw S

## 2017-11-08 ENCOUNTER — Encounter (HOSPITAL_COMMUNITY): Payer: Self-pay | Admitting: Orthopedic Surgery

## 2017-12-07 ENCOUNTER — Other Ambulatory Visit: Payer: Self-pay | Admitting: Orthopedic Surgery

## 2017-12-07 DIAGNOSIS — R52 Pain, unspecified: Secondary | ICD-10-CM

## 2017-12-07 DIAGNOSIS — R531 Weakness: Secondary | ICD-10-CM

## 2017-12-10 ENCOUNTER — Ambulatory Visit
Admission: RE | Admit: 2017-12-10 | Discharge: 2017-12-10 | Disposition: A | Discharge status: Home / Self Care | Payer: Medicare Other | Source: Ambulatory Visit | Attending: Orthopedic Surgery | Admitting: Orthopedic Surgery

## 2017-12-10 DIAGNOSIS — R531 Weakness: Secondary | ICD-10-CM

## 2017-12-10 DIAGNOSIS — R52 Pain, unspecified: Secondary | ICD-10-CM

## 2017-12-11 ENCOUNTER — Other Ambulatory Visit: Payer: Medicare Other

## 2017-12-25 DIAGNOSIS — Z9889 Other specified postprocedural states: Secondary | ICD-10-CM | POA: Insufficient documentation

## 2018-05-13 ENCOUNTER — Other Ambulatory Visit: Payer: Self-pay | Admitting: Orthopedic Surgery

## 2018-05-13 DIAGNOSIS — M25561 Pain in right knee: Secondary | ICD-10-CM

## 2018-05-17 ENCOUNTER — Ambulatory Visit
Admission: RE | Admit: 2018-05-17 | Discharge: 2018-05-17 | Disposition: A | Discharge status: Home / Self Care | Payer: Medicare Other | Source: Ambulatory Visit | Attending: Orthopedic Surgery | Admitting: Orthopedic Surgery

## 2018-05-17 DIAGNOSIS — M25561 Pain in right knee: Secondary | ICD-10-CM

## 2018-09-07 ENCOUNTER — Ambulatory Visit (HOSPITAL_COMMUNITY)
Admission: EM | Admit: 2018-09-07 | Discharge: 2018-09-07 | Disposition: A | Discharge status: Home / Self Care | Payer: Medicare Other | Attending: Family Medicine | Admitting: Family Medicine

## 2018-09-07 ENCOUNTER — Encounter (HOSPITAL_COMMUNITY): Payer: Self-pay | Admitting: Emergency Medicine

## 2018-09-07 DIAGNOSIS — R1031 Right lower quadrant pain: Secondary | ICD-10-CM | POA: Diagnosis not present

## 2018-09-07 MED ORDER — METRONIDAZOLE 500 MG PO TABS: 500.0000 mg | ORAL_TABLET | Freq: Two times a day (BID) | ORAL | 0 refills | Status: AC

## 2018-09-07 MED ORDER — CIPROFLOXACIN HCL 500 MG PO TABS: 500.0000 mg | ORAL_TABLET | Freq: Two times a day (BID) | ORAL | 0 refills | Status: AC

## 2018-09-07 NOTE — Discharge Instructions (Signed)
Please begin cipro and flagyl twice daily for the next week Please monitor your abdominal pain IF worsening, not improving with antibiotics/treatment for antibiotics, please follow up in emergency room

## 2018-09-07 NOTE — ED Triage Notes (Signed)
Pt sts lower abd pain that pt thinks is from diverticulitis; pt sts hx of same in past

## 2018-09-08 NOTE — ED Provider Notes (Signed)
[facility]    CSN: [id number] Arrival date & time: 09/07/18  1032     History   Chief Complaint Chief Complaint  Patient presents with  . Abdominal Pain    HPI Joseph Melton is a 68 y.o. male history of arthritis, diverticulitis presenting today for evaluation of abdominal pain.  Patient states that he has had right lower abdominal pain for the past 3 to 4 days.  On Wednesday he felt constipated as if he had a break in his stomach.  On Thursday he drank some magnesium citrate which helped with the sensation, but he has had pain that has felt more similar to when he has had diverticulitis in the past.  He is unsure if he typically has diverticulitis on the left or right side.  Denies history of appendectomy.  He has not had any nausea or vomiting.  Denies any blood in the stool.  Denies fevers.  HPI  Past Medical History:  Diagnosis Date  . Arthritis   . Diverticulitis     There are no active problems to display for this patient.   Past Surgical History:  Procedure Laterality Date  . COLONOSCOPY    . FRACTURE SURGERY Right    wrist  . HERNIA REPAIR     inguinal hernia repair x 2  . LUMBAR DISC SURGERY     x 2  . LUMBAR FUSION     L 3, 4, 5  . MENISCUS REPAIR Left   . SHOULDER ARTHROSCOPY WITH ROTATOR CUFF REPAIR AND SUBACROMIAL DECOMPRESSION Left 11/06/2017   Procedure: SHOULDER ARTHROSCOPY WITH ROTATOR CUFF REPAIR AND SUBACROMIAL DECOMPRESSION;  Surgeon: Jones Broomhandler, Justin, MD;  Location: [facility] OR;  Service: Orthopedics;  Laterality: Left;       Home Medications    Prior to Admission medications   Medication Sig Start Date End Date Taking? Authorizing Provider  Calcium Polycarbophil (FIBER-CAPS PO) Take by mouth. Takes 3 tabs 3 times a day    [provider]  ciprofloxacin (CIPRO) 500 MG tablet Take 1 tablet (500 mg total) by mouth every 12 (twelve) hours for 7 days. 09/07/18 09/14/18  Jenissa Tyrell C, PA-C  loratadine (CLARITIN) 10 MG tablet Take  10 mg by mouth daily as needed for allergies (pt states he may take twice a month).    [provider]  methocarbamol (ROBAXIN) 500 MG tablet Take 1 tablet (500 mg total) by mouth 3 (three) times daily. 11/06/17   Jiles HaroldLaliberte, Danielle, PA-C  metroNIDAZOLE (FLAGYL) 500 MG tablet Take 1 tablet (500 mg total) by mouth 2 (two) times daily for 7 days. 09/07/18 09/14/18  Blayze Haen C, PA-C  oxyCODONE-acetaminophen (PERCOCET) 5-325 MG tablet Take 1-2 tablets by mouth every 4 (four) hours as needed for severe pain. 11/06/17   Jiles HaroldLaliberte, Danielle, PA-C    Family History Family History  Problem Relation Age of Onset  . Dementia Mother   . Melanoma Father     Social History Social History   Tobacco Use  . Smoking status: Former Smoker    Last attempt to quit: 06/22/2013    Years since quitting: 5.2  . Smokeless tobacco: Never Used  Substance Use Topics  . Alcohol use: Yes    Comment: occasional (twice a week)  . Drug use: No     Allergies   Patient has no known allergies.   Review of Systems Review of Systems  Constitutional: Negative for activity change, appetite change, chills, fatigue and fever.  HENT: Negative for congestion,  ear pain, rhinorrhea, sinus pressure, sore throat and trouble swallowing.   Eyes: Negative for discharge and redness.  Respiratory: Negative for cough, chest tightness and shortness of breath.   Cardiovascular: Negative for chest pain.  Gastrointestinal: Positive for abdominal pain. Negative for diarrhea, nausea and vomiting.  Genitourinary: Negative for dysuria and hematuria.  Musculoskeletal: Negative for myalgias.  Skin: Negative for rash.  Neurological: Negative for dizziness, light-headedness and headaches.     Physical Exam Triage Vital Signs ED Triage Vitals [09/07/18 1122]  Enc Vitals Group     BP 123/86     Pulse Rate 84     Resp 18     Temp 98.1 F (36.7 C)     Temp Source Temporal     SpO2 98 %     Weight      Height       Head Circumference      Peak Flow      Pain Score 5     Pain Loc      Pain Edu?      Excl. in GC?    No data found.  Updated Vital Signs BP 123/86 (BP Location: Right Arm)   Pulse 84   Temp 98.1 F (36.7 C) (Temporal)   Resp 18   SpO2 98%   Visual Acuity Right Eye Distance:   Left Eye Distance:   Bilateral Distance:    Right Eye Near:   Left Eye Near:    Bilateral Near:     Physical Exam Vitals signs and nursing note reviewed.  Constitutional:      Appearance: He is well-developed.  HENT:     Head: Normocephalic and atraumatic.  Eyes:     Conjunctiva/sclera: Conjunctivae normal.  Neck:     Musculoskeletal: Neck supple.  Cardiovascular:     Rate and Rhythm: Normal rate and regular rhythm.     Heart sounds: No murmur.  Pulmonary:     Effort: Pulmonary effort is normal. No respiratory distress.     Breath sounds: Normal breath sounds.     Comments: Breathing comfortably at rest, CTABL, no wheezing, rales or other adventitious sounds auscultated Abdominal:     Palpations: Abdomen is soft.     Tenderness: There is abdominal tenderness.     Comments: Abdomen soft, nondistended, mild tenderness to palpation of lateral right lower quadrant, negative rebound, negative Rovsing  Easily moving in room without worsening of abdominal pain  Skin:    General: Skin is warm and dry.  Neurological:     Mental Status: He is alert.      UC Treatments / Results  Labs (all labs ordered are listed, but only abnormal results are displayed) Labs Reviewed - No data to display  EKG None  Radiology No results found.  Procedures Procedures (including critical care time)  Medications Ordered in UC Medications - No data to display  Initial Impression / Assessment and Plan / UC Course  I have reviewed the triage vital signs and the nursing notes.  Pertinent labs & imaging results that were available during my care of the patient were reviewed by me and considered in my  medical decision making (see chart for details).     Patient with concerns for diverticulitis with history, discussed with patient typically diverticulitis is on left side versus the right.  But given pain feeling similar will initiate treatment with Cipro and Flagyl, advised if he does not have improvement of his symptoms and has worsening pain in  the right lower area to go to emergency room for further evaluation to rule out appendicitis versus other intra-abdominal cause.  At this time negative for peritoneal signs.  Also discussed with patient possibility of constipation being cause, but he feels this was fully resolved after taking the magnesium citrate.  Continue to monitor,Discussed strict return precautions. Patient verbalized understanding and is agreeable with plan.  Final Clinical Impressions(s) / UC Diagnoses   Final diagnoses:  Right lower quadrant abdominal pain     Discharge Instructions     Please begin cipro and flagyl twice daily for the next week Please monitor your abdominal pain IF worsening, not improving with antibiotics/treatment for antibiotics, please follow up in emergency room   ED Prescriptions    Medication Sig Dispense Auth. Provider   ciprofloxacin (CIPRO) 500 MG tablet Take 1 tablet (500 mg total) by mouth every 12 (twelve) hours for 7 days. 14 tablet Destenee Guerry C, PA-C   metroNIDAZOLE (FLAGYL) 500 MG tablet Take 1 tablet (500 mg total) by mouth 2 (two) times daily for 7 days. 14 tablet Jameyah Fennewald, [place] C, PA-C     Controlled Substance Prescriptions [place] Controlled Substance Registry consulted? Not Applicable   Lew Dawes, [place] 09/08/18 [phone]

## 2019-07-31 ENCOUNTER — Ambulatory Visit: Payer: Medicare Other | Attending: Internal Medicine

## 2019-08-15 ENCOUNTER — Ambulatory Visit: Payer: Medicare Other | Attending: Internal Medicine

## 2019-08-15 ENCOUNTER — Other Ambulatory Visit: Payer: Self-pay

## 2019-08-15 DIAGNOSIS — Z20822 Contact with and (suspected) exposure to covid-19: Secondary | ICD-10-CM

## 2019-08-16 LAB — NOVEL CORONAVIRUS, NAA: SARS-CoV-2, NAA: NOT DETECTED

## 2019-12-06 ENCOUNTER — Telehealth: Payer: Medicare Other | Admitting: Nurse Practitioner

## 2019-12-06 DIAGNOSIS — J0100 Acute maxillary sinusitis, unspecified: Secondary | ICD-10-CM

## 2019-12-06 MED ORDER — AMOXICILLIN-POT CLAVULANATE 875-125 MG PO TABS: 1.0000 | ORAL_TABLET | Freq: Two times a day (BID) | ORAL | 0 refills | Status: DC

## 2019-12-06 NOTE — Progress Notes (Signed)

## 2020-08-18 ENCOUNTER — Telehealth (INDEPENDENT_AMBULATORY_CARE_PROVIDER_SITE_OTHER): Payer: Medicare Other | Admitting: Family Medicine

## 2020-08-18 ENCOUNTER — Encounter: Payer: Self-pay | Admitting: Family Medicine

## 2020-08-18 VITALS — Ht 70.0 in | Wt 205.0 lb

## 2020-08-18 DIAGNOSIS — U071 COVID-19: Secondary | ICD-10-CM | POA: Diagnosis not present

## 2020-08-18 NOTE — Progress Notes (Signed)
Patient: Joseph Melton MRN: [id number] DOB: 1950/11/01 PCP: Patient, No Pcp Per     I connected with Guinevere Scarlet Ribeiro on 08/18/20 at 12:24pm by a video enabled telemedicine application and verified that I am speaking with the correct person using two identifiers.  Location patient: Home Location provider: [facility], Office Persons participating in this virtual visit: Rod Ohman and Dr. Artis Flock   I discussed the limitations of evaluation and management by telemedicine and the availability of in person appointments. The patient expressed understanding and agreed to proceed.   Interactive audio and video telecommunications were attempted between this provider and patient, however failed, due to patient having technical difficulties OR patient did not have access to video capability.  We continued and completed visit with audio only.   Subjective:  Chief Complaint  Patient presents with  . Covid Positive    HPI: The patient is a 70 y.o. male who presents today for positive Covid result. Symptoms started late Sunday evening. He tested positive with a home test yesterday.  He was having fevers, sore throat and chest tightness. He also had a cough as well, but his chest was so tight it was hard to cough.  He mentions breaking out in a sweat today, but feels normal. He no longer is short of breath and actually feels much better today. He doesn't think he needs much of anything. Denies any coughing today, tightness in chest, fevers, weakness, etc.   He is triple vaccinated. Booster done 3rd week of December.   Review of Systems  Constitutional: Negative for chills and fatigue.  HENT: Negative for congestion and sore throat.   Respiratory: Negative for cough, chest tightness and shortness of breath.   Neurological: Negative for dizziness and headaches.    Allergies Patient has No Known Allergies.  Past Medical History Patient  has a past medical history of Arthritis and  Diverticulitis.  Surgical History Patient  has a past surgical history that includes Fracture surgery (Right); Lumbar fusion; Lumbar disc surgery; Hernia repair; Colonoscopy; Meniscus repair (Left); and Shoulder arthroscopy with rotator cuff repair and subacromial decompression (Left, 11/06/2017).  Family History Pateint's family history includes Dementia in his mother; Melanoma in his father.  Social History Patient  reports that he quit smoking about 7 years ago. He has never used smokeless tobacco. He reports current alcohol use. He reports that he does not use drugs.    Objective: Vitals:   08/18/20 1206  SpO2: 95%  Weight: 205 lb 0.4 oz (93 kg)  Height: 5\' 10"  (1.778 m)    Body mass index is 29.42 kg/m.  Physical Exam Vitals reviewed.  Pulmonary:     Effort: Pulmonary effort is normal.     Comments: Talking in full complete sentences with no audible effort/work of breathing.        Assessment/plan: 1. COVID-19 -doing well. Woke up and is asymptomatic today besides mild congestion. Glad he's feeling much better.  -starting him on treatment nutraceutical bundle including zinc sulfate, vit D, vit C, quercetin and melatonin 5-15+ days depending on symptoms. He has already started this.  -discussed ivermectin and other treatment options, but do not think he needs this. Back to baseline, triple vaccinated with no health issues.  -At this point no pulmonary signs/symptoms. If so we will add prozac 30mg /day, pulmicort and if needed anti-androgens and oral steroids. Will follow closely. They have my number if feels like he is no longer doing so well.  -gargle mouthwash TID -outside as  much as possible.  -pulse ox >93-94% (staying above 95%)   15 minutes spent in audio with patient over medical decision making. Video could not be done.   Return if symptoms worsen or fail to improve.   Orland Mustard, MD [facility]  08/18/2020

## 2020-09-14 LAB — CBC AND DIFFERENTIAL
HCT: 42 (ref 41–53)
Hemoglobin: 14.6 (ref 13.5–17.5)
Neutrophils Absolute: 2.7
Platelets: 199 (ref 150–399)
WBC: 5.8

## 2020-09-14 LAB — HEPATIC FUNCTION PANEL
ALT: 14 (ref 10–40)
AST: 24 (ref 14–40)
Alkaline Phosphatase: 68 (ref 25–125)
Bilirubin, Total: 0.3

## 2020-09-14 LAB — BASIC METABOLIC PANEL
BUN: 29 — AB (ref 4–21)
CO2: 22 (ref 13–22)
Chloride: 105 (ref 99–108)
Creatinine: 1 (ref 0.6–1.3)
Glucose: 106
Potassium: 5 (ref 3.4–5.3)
Sodium: 139 (ref 137–147)

## 2020-09-14 LAB — COMPREHENSIVE METABOLIC PANEL
Albumin: 4.3 (ref 3.5–5.0)
Calcium: 9.4 (ref 8.7–10.7)
GFR calc Af Amer: 88
GFR calc non Af Amer: 76
Globulin: 2.4

## 2020-09-14 LAB — LIPID PANEL
Cholesterol: 132 (ref 0–200)
HDL: 41 (ref 35–70)
LDL Cholesterol: 76
Triglycerides: 72 (ref 40–160)

## 2020-09-14 LAB — TSH: TSH: 2.18 (ref 0.41–5.90)

## 2020-09-14 LAB — CBC: RBC: 4.7 (ref 3.87–5.11)

## 2020-09-14 LAB — HEMOGLOBIN A1C: Hemoglobin A1C: 5.6

## 2020-09-14 LAB — PSA: PSA: 1.3

## 2021-03-10 NOTE — Progress Notes (Addendum)
Subjective:    Patient ID: Joseph Melton, male    DOB: 04-21-1951, 70 y.o.   MRN: [id number]  HPI: Joseph Melton is a 70 y.o. male presenting for new patient visit to establish care.  Introduced to Designer, jewellery role and practice setting.  All questions answered.  Discussed provider/patient relationship and expectations.  Chief Complaint  Patient presents with   new pt    Deals with Body cramping    Chronic pain in neck - was going to have surgery years ago, but pain suddenly got better.  It worsened a few years after and by the time he was going to have surgery, he reports he had imaging that showed he does not have discs left in his next and to resolve the pain, he would need a fusion of all of his neck bones.  Since that time, he has managed his chronic pain with ibuprofen 800 mg three times daily.  He tries to take this with food.  He does have pain that radiates down his left arm and his left hand is numb.    History of melanoma in his father - reports he saw Dermatologist years ago.  Not interested in yearly skin checks at this time.  Checks blood pressure at home and is normally 110-120/80.  He has daughters who are nurses.    Reports severe cramping that comes on suddenly multiple times daily.  He has seen multiple providers and reports nobody knows the cause.  He has tried mustard, stretching, walking, magnesium, muscle relaxant.  He is trying to increase water intake - previously was drinking a lot of caffeine.  No Known Allergies  Outpatient Encounter Medications as of 03/11/2021  Medication Sig   Ibuprofen 200 MG CAPS Take by mouth.   [DISCONTINUED] amoxicillin-clavulanate (AUGMENTIN) 875-125 MG tablet Take 1 tablet by mouth 2 (two) times daily. (Patient not taking: Reported on 08/18/2020)   [DISCONTINUED] Calcium Polycarbophil (FIBER-CAPS PO) Take by mouth. Takes 3 tabs 3 times a day (Patient not taking: Reported on 08/18/2020)   [DISCONTINUED] loratadine (CLARITIN)  10 MG tablet Take 10 mg by mouth daily as needed for allergies (pt states he may take twice a month). (Patient not taking: Reported on 08/18/2020)   [DISCONTINUED] methocarbamol (ROBAXIN) 500 MG tablet Take 1 tablet (500 mg total) by mouth 3 (three) times daily. (Patient not taking: Reported on 08/18/2020)   [DISCONTINUED] nicotine polacrilex (NICORETTE) 2 MG gum Take by mouth.   [DISCONTINUED] oxyCODONE-acetaminophen (PERCOCET) 5-325 MG tablet Take 1-2 tablets by mouth every 4 (four) hours as needed for severe pain. (Patient not taking: Reported on 08/18/2020)   No facility-administered encounter medications on file as of 03/11/2021.    Active Ambulatory Problems    Diagnosis Date Noted   S/P right knee arthroscopy 12/25/2017   Chronic neck pain 03/11/2021   History of diverticulitis 03/11/2021   Muscle cramps 03/11/2021   Resolved Ambulatory Problems    Diagnosis Date Noted   No Resolved Ambulatory Problems   Past Medical History:  Diagnosis Date   Arthritis    Diverticulitis     Past Medical History:  Diagnosis Date   Arthritis    Diverticulitis     Past Surgical History:  Procedure Laterality Date   COLONOSCOPY     FRACTURE SURGERY Right    wrist   HERNIA REPAIR     inguinal hernia repair x 2   LUMBAR DISC SURGERY     x 2   LUMBAR FUSION  L 3, 4, 5   MENISCUS REPAIR Left    SHOULDER ARTHROSCOPY WITH ROTATOR CUFF REPAIR AND SUBACROMIAL DECOMPRESSION Left 11/06/2017   Procedure: SHOULDER ARTHROSCOPY WITH ROTATOR CUFF REPAIR AND SUBACROMIAL DECOMPRESSION;  Surgeon: Tania Ade, MD;  Location: [facility];  Service: Orthopedics;  Laterality: Left;    Social History   Tobacco Use   Smoking status: Former    Types: Cigarettes    Quit date: 06/22/2013    Years since quitting: 7.7   Smokeless tobacco: Never  Vaping Use   Vaping Use: Never used  Substance Use Topics   Alcohol use: Yes    Comment: occasional (once a week)   Drug use: No    Family History  Problem  Relation Age of Onset   Cancer Mother    Dementia Mother    Melanoma Father    Cancer Sister     Review of Systems Per HPI unless specifically indicated above     Objective:    BP 132/78   Pulse 71   Temp 98.2 F (36.8 C)   Ht _0  (1.778 m)   Wt 215 lb 3.2 oz (97.6 kg)   SpO2 96%   BMI 30.88 kg/m   Wt Readings from Last 3 Encounters:  03/11/21 215 lb 3.2 oz (97.6 kg)  08/18/20 205 lb 0.4 oz (93 kg)  11/06/17 205 lb (93 kg)    Physical Exam Vitals and nursing note reviewed.  Constitutional:      General: He is not in acute distress.    Appearance: Normal appearance. He is normal weight. He is not toxic-appearing.  HENT:     Head: Normocephalic and atraumatic.  Neck:     Vascular: No carotid bruit.  Cardiovascular:     Rate and Rhythm: Normal rate.     Heart sounds: Normal heart sounds. No murmur heard. Pulmonary:     Effort: Pulmonary effort is normal. No respiratory distress.     Breath sounds: Normal breath sounds. No wheezing or rhonchi.  Abdominal:     General: Abdomen is flat. Bowel sounds are normal.     Palpations: Abdomen is soft.  Musculoskeletal:        General: Normal range of motion.     Cervical back: Normal range of motion.     Right lower leg: No edema.     Left lower leg: No edema.  Skin:    General: Skin is warm and dry.     Capillary Refill: Capillary refill takes less than 2 seconds.     Coloration: Skin is not jaundiced or pale.  Neurological:     General: No focal deficit present.     Mental Status: He is alert and oriented to person, place, and time.     Motor: No weakness.     Gait: Gait normal.  Psychiatric:        Mood and Affect: Mood normal.        Behavior: Behavior normal.        Thought Content: Thought content normal.        Judgment: Judgment normal.      Assessment & Plan:   Problem List Items Addressed This Visit       Other   Muscle cramps    Chronic, stable.  Patient is trying to increase water intake.   Discussed goal of 64 ounces daily.  Encouraged regular physical activity, yoga/stretching.  Follow-up if symptoms worsen.      Chronic neck pain  Chronic, stable.  Patient is currently taking ibuprofen 800 mg 3 times daily every day for neck pain.  He is not interested in other medications at this time.  We discussed long-term side effects of NSAID use including gastritis, ulcers, long-term kidney damage.  We discussed possibility of starting on daily PPI, however he wishes to think about this for now.  Plan to follow-up if symptoms worsen or with any needs in the meantime.      Other Visit Diagnoses     Encounter to establish care    -  Primary       I reviewed lab work from February 2020 that patient brought with him today.  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  Otherwise, labs are stable and we will plan to recheck next year.  Follow up plan: Return in about 6 months (around 09/11/2021) for follow up.

## 2021-03-11 ENCOUNTER — Other Ambulatory Visit: Payer: Self-pay

## 2021-03-11 ENCOUNTER — Encounter: Payer: Self-pay | Admitting: Nurse Practitioner

## 2021-03-11 ENCOUNTER — Ambulatory Visit (INDEPENDENT_AMBULATORY_CARE_PROVIDER_SITE_OTHER): Payer: Medicare Other | Admitting: Nurse Practitioner

## 2021-03-11 VITALS — BP 132/78 | HR 71 | Temp 98.2°F | Ht 70.0 in | Wt 215.2 lb

## 2021-03-11 DIAGNOSIS — R252 Cramp and spasm: Secondary | ICD-10-CM | POA: Insufficient documentation

## 2021-03-11 DIAGNOSIS — Z7689 Persons encountering health services in other specified circumstances: Secondary | ICD-10-CM

## 2021-03-11 DIAGNOSIS — M542 Cervicalgia: Secondary | ICD-10-CM

## 2021-03-11 DIAGNOSIS — Z8719 Personal history of other diseases of the digestive system: Secondary | ICD-10-CM | POA: Insufficient documentation

## 2021-03-11 DIAGNOSIS — G8929 Other chronic pain: Secondary | ICD-10-CM | POA: Insufficient documentation

## 2021-03-11 NOTE — Assessment & Plan Note (Signed)
Chronic, stable.  Patient is trying to increase water intake.  Discussed goal of 64 ounces daily.  Encouraged regular physical activity, yoga/stretching.  Follow-up if symptoms worsen.

## 2021-03-11 NOTE — Assessment & Plan Note (Signed)
Chronic, stable.  Patient is currently taking ibuprofen 800 mg 3 times daily every day for neck pain.  He is not interested in other medications at this time.  We discussed long-term side effects of NSAID use including gastritis, ulcers, long-term kidney damage.  We discussed possibility of starting on daily PPI, however he wishes to think about this for now.  Plan to follow-up if symptoms worsen or with any needs in the meantime.

## 2021-09-15 ENCOUNTER — Other Ambulatory Visit: Payer: Self-pay

## 2021-09-15 ENCOUNTER — Emergency Department (HOSPITAL_BASED_OUTPATIENT_CLINIC_OR_DEPARTMENT_OTHER)
Admission: EM | Admit: 2021-09-15 | Discharge: 2021-09-15 | Disposition: A | Discharge status: Home / Self Care | Payer: Medicare PPO | Attending: Emergency Medicine | Admitting: Emergency Medicine

## 2021-09-15 ENCOUNTER — Encounter (HOSPITAL_BASED_OUTPATIENT_CLINIC_OR_DEPARTMENT_OTHER): Payer: Self-pay | Admitting: Emergency Medicine

## 2021-09-15 ENCOUNTER — Emergency Department (HOSPITAL_BASED_OUTPATIENT_CLINIC_OR_DEPARTMENT_OTHER): Payer: Medicare PPO | Admitting: Radiology

## 2021-09-15 DIAGNOSIS — F17210 Nicotine dependence, cigarettes, uncomplicated: Secondary | ICD-10-CM | POA: Insufficient documentation

## 2021-09-15 DIAGNOSIS — R0789 Other chest pain: Secondary | ICD-10-CM | POA: Insufficient documentation

## 2021-09-15 DIAGNOSIS — R079 Chest pain, unspecified: Secondary | ICD-10-CM

## 2021-09-15 LAB — BASIC METABOLIC PANEL
Anion gap: 10 (ref 5–15)
BUN: 29 mg/dL — ABNORMAL HIGH (ref 8–23)
CO2: 24 mmol/L (ref 22–32)
Calcium: 9 mg/dL (ref 8.9–10.3)
Chloride: 107 mmol/L (ref 98–111)
Creatinine, Ser: 1.04 mg/dL (ref 0.61–1.24)
GFR, Estimated: >60 mL/min (ref >60)
Glucose, Bld: 98 mg/dL (ref 70–99)
Potassium: 3.9 mmol/L (ref 3.5–5.1)
Sodium: 141 mmol/L (ref 135–145)

## 2021-09-15 LAB — CBC
HCT: 45.7 % (ref 39.0–52.0)
Hemoglobin: 15 g/dL (ref 13.0–17.0)
MCH: 30.2 pg (ref 26.0–34.0)
MCHC: 32.8 g/dL (ref 30.0–36.0)
MCV: 92.1 fL (ref 80.0–100.0)
Platelets: 199 10*3/uL (ref 150–400)
RBC: 4.96 MIL/uL (ref 4.22–5.81)
RDW: 13.5 % (ref 11.5–15.5)
WBC: 8.7 10*3/uL (ref 4.0–10.5)
nRBC: 0 % (ref 0.0–0.2)

## 2021-09-15 LAB — HEPATIC FUNCTION PANEL
ALT: 13 U/L (ref 0–44)
AST: 25 U/L (ref 15–41)
Albumin: 4.4 g/dL (ref 3.5–5.0)
Alkaline Phosphatase: 53 U/L (ref 38–126)
Bilirubin, Direct: 0.1 mg/dL (ref 0.0–0.2)
Indirect Bilirubin: 0.3 mg/dL (ref 0.3–0.9)
Total Bilirubin: 0.4 mg/dL (ref 0.3–1.2)
Total Protein: 6.9 g/dL (ref 6.5–8.1)

## 2021-09-15 LAB — D-DIMER, QUANTITATIVE: D-Dimer, Quant: 0.36 ug/mL-FEU (ref 0.00–0.50)

## 2021-09-15 LAB — TROPONIN I (HIGH SENSITIVITY)
Troponin I (High Sensitivity): 4 ng/L (ref <18)
Troponin I (High Sensitivity): 5 ng/L (ref <18)

## 2021-09-15 LAB — LIPASE, BLOOD: Lipase: 53 U/L — ABNORMAL HIGH (ref 11–51)

## 2021-09-15 MED ORDER — SODIUM CHLORIDE 0.9% FLUSH
3.0000 mL | Freq: Once | INTRAVENOUS | Status: DC
  Filled 2021-09-15: qty 3

## 2021-09-15 MED ORDER — ASPIRIN 81 MG PO CHEW
324.0000 mg | CHEWABLE_TABLET | Freq: Once | ORAL | Status: AC
  Administered 2021-09-15: 324 mg via ORAL
  Filled 2021-09-15: qty 4

## 2021-09-15 NOTE — ED Provider Notes (Signed)
Emergency Department Provider Note   I have reviewed the triage vital signs and the nursing notes.   HISTORY  Chief Complaint Chest Pain   HPI Joseph Melton is a 72 y.o. male with PMH arthritis and prior smoking history (20 pack year) presents to the emergency department for evaluation of chest tightness starting yesterday.  He describes a central tightness last night without radiation or clear modifying factors.  He had some associated sharp chest pain last night which has resolved.  He took a full dose aspirin then and went to sleep.  He awoke this morning with some continued mild tightness.  He went out to do some work around the yard and states he was digging up some roots with a shovel when he felt worsening discomfort which ultimately prompted him to seek ED evaluation.  He had some friends come over who work for the [employer].  They checked his blood pressure and found it to be elevated.  He does not take any medicine for high blood pressure or diabetes.  He has no family history of ACS.  No prior left heart cath or other provocative testing.    Past Medical History:  Diagnosis Date   Arthritis    Diverticulitis     Review of Systems  Constitutional: No fever/chills Eyes: No visual changes. ENT: No sore throat. Cardiovascular: Positive chest pain. Respiratory: Denies shortness of breath. Gastrointestinal: No abdominal pain.  No nausea, no vomiting.  No diarrhea.  No constipation. Genitourinary: Negative for dysuria. Musculoskeletal: Negative for back pain. Skin: Negative for rash. Neurological: Negative for headaches, focal weakness or numbness.   ____________________________________________   PHYSICAL EXAM:  VITAL SIGNS: ED Triage Vitals  Enc Vitals Group     BP 09/15/21 1430 126/86     Pulse Rate 09/15/21 1430 73     Resp 09/15/21 1430 (!) 22     SpO2 09/15/21 1430 96 %     Weight 09/15/21 1418 215 lb (97.5 kg)     Height 09/15/21 1418 5\' 10"   (1.778 m)   Constitutional: Alert and oriented. Well appearing and in no acute distress. Eyes: Conjunctivae are normal.  Head: Atraumatic. Nose: No congestion/rhinnorhea. Mouth/Throat: Mucous membranes are moist.  Neck: No stridor Cardiovascular: Normal rate, regular rhythm. Good peripheral circulation. Grossly normal heart sounds.   Respiratory: Normal respiratory effort.  No retractions. Lungs CTAB. Gastrointestinal: Soft and nontender. No distention.  Musculoskeletal: No gross deformities of extremities. Neurologic:  Normal speech and language.  Skin:  Skin is warm, dry and intact. No rash noted.   ____________________________________________   LABS (all labs ordered are listed, but only abnormal results are displayed)  Labs Reviewed  BASIC METABOLIC PANEL - Abnormal; Notable for the following components:      Result Value   BUN 29 (*)    All other components within normal limits  LIPASE, BLOOD - Abnormal; Notable for the following components:   Lipase 53 (*)    All other components within normal limits  CBC  HEPATIC FUNCTION PANEL  D-DIMER, QUANTITATIVE (NOT AT [facility])  TROPONIN I (HIGH SENSITIVITY)  TROPONIN I (HIGH SENSITIVITY)   ____________________________________________  EKG   EKG Interpretation  Date/Time:  Thursday September 15 2021 14:20:13 EST Ventricular Rate:  78 PR Interval:  168 QRS Duration: 95 QT Interval:  366 QTC Calculation: 417 R Axis:   42 Text Interpretation: Sinus rhythm Consider left atrial enlargement Low voltage, precordial leads Confirmed by Nanda Quinton [phone]) on 09/15/2021 2:24:50 PM  ____________________________________________  RADIOLOGY  DG Chest 2 View  Result Date: 09/15/2021 CLINICAL DATA:  Chest pain. EXAM: CHEST - 2 VIEW COMPARISON:  April 16, 2008. FINDINGS: The heart size and mediastinal contours are within normal limits. Both lungs are clear. The visualized skeletal structures are unremarkable. IMPRESSION:  No active cardiopulmonary disease. Electronically Signed   By: Marijo Conception M.D.   On: 09/15/2021 14:57    ____________________________________________   PROCEDURES  Procedure(s) performed:   Procedures  None  ____________________________________________   INITIAL IMPRESSION / ASSESSMENT AND PLAN / ED COURSE  Pertinent labs & imaging results that were available during my care of the patient were reviewed by me and considered in my medical decision making (see chart for details).   This patient is Presenting for Evaluation of CP, which does require a range of treatment options, and is a complaint that involves a high risk of morbidity and mortality.  The Differential Diagnoses includes all life-threatening causes for chest pain. This includes but is not exclusive to acute coronary syndrome, aortic dissection, pulmonary embolism, cardiac tamponade, community-acquired pneumonia, pericarditis, musculoskeletal chest wall pain, etc.   Critical Interventions- ASA   Medications  aspirin chewable tablet 324 mg (324 mg Oral Given 09/15/21 1526)    Reassessment after intervention:  Patient remains comfortable and HDS.   I did obtain Additional Historical Information from family at bedside.  I decided to review pertinent External Data, and in summary no recent ED visits or prior Cardiology evaluation, heart cath reports, or ECHO.   Clinical Laboratory Tests Ordered, included Troponin which is WNL. D dimer which is normal. No leukocytosis. No AKI.   Radiologic Tests Ordered, included CXR. I independently interpreted the images and agree with radiology interpretation.   Cardiac Monitor Tracing which shows NSR.   Social Determinants of Health Risk former smoker. Quit 10 years prior. Patient with a 20 pack year history (lifetime).  Medical Decision Making: Summary:  Patient presents emergency department valuation chest discomfort.  He has had mostly constant tightness with some  occasional atypical sharp pains which were mainly last night and have since resolved.  Does have a smoking history but overall lacks many risk factors for ACS including medical comorbidity or family history.  Some features of his story are more concerning for ACS.  EKG shows no acute ischemic change.  Troponin pending.  With sharp chest pain last night have added on a D-dimer but patient is overall active and low risk for PE by [place].   HEART score 4.   Care transferred to Dr. Zenia Resides pending second troponin and re-evaluation.   Disposition: discharge  ____________________________________________  FINAL CLINICAL IMPRESSION(S) / ED DIAGNOSES  Final diagnoses:  Chest pain, unspecified type    Note:  This document was prepared using Dragon voice recognition software and may include unintentional dictation errors.  Nanda Quinton, MD, [facility] Emergency Medicine    Moira Umholtz, W[facility], MD 09/16/21 [phone]

## 2021-09-15 NOTE — ED Provider Notes (Signed)
71 year old male presents with chest pain which began yesterday.  Patient signed to me by Dr. Jacqulyn Bath.  Patient's pain became worse today with exertion and associated with dizziness.  For troponin here is negative.  EKG reviewed by me and showed no acute findings.  5:56 PM Patient offered admission for work-up of his chest pain and has deferred.  He understands the risk of sudden death.  Has appointment scheduled with his cardiologist soon.  Encouraged to return if his symptoms come worse   [name], MD 09/15/21 1756

## 2021-09-15 NOTE — ED Triage Notes (Signed)
C/o of sharp stabbing chest pains last night, today feels heaviness and tightness in chest. Elevated blood pressure today 170/120. Pain is in mid chest does not radiate.

## 2021-09-16 ENCOUNTER — Telehealth: Payer: Self-pay

## 2021-09-16 NOTE — Telephone Encounter (Signed)
The patient was evaluated at [facility] 2/16 for exertional CP symptoms.  Per his daughter, Cyndia Diver, his EKG looked fine and troponins negative. The provider wished to admit the patient but Joseph Melton declined.   Scheduled the patient with Dr. Excell Seltzer 09/23/2021 for outpatient Cardiology evaluation.  ER precautions reviewed.

## 2021-09-23 ENCOUNTER — Ambulatory Visit: Payer: Medicare PPO | Admitting: Cardiovascular Disease

## 2021-09-23 ENCOUNTER — Other Ambulatory Visit: Payer: Self-pay

## 2021-09-23 ENCOUNTER — Encounter: Payer: Self-pay | Admitting: Cardiovascular Disease

## 2021-09-23 VITALS — BP 124/90 | HR 76 | Ht 70.0 in | Wt 216.6 lb

## 2021-09-23 DIAGNOSIS — R072 Precordial pain: Secondary | ICD-10-CM | POA: Diagnosis not present

## 2021-09-23 DIAGNOSIS — R0602 Shortness of breath: Secondary | ICD-10-CM

## 2021-09-23 MED ORDER — METOPROLOL TARTRATE 100 MG PO TABS: ORAL_TABLET | ORAL | 0 refills | Status: DC

## 2021-09-23 NOTE — Patient Instructions (Addendum)
Medication Instructions:  Metoprolol Tartrate 100mg  prior to scan  *If you need a refill on your cardiac medications before your next appointment, please call your pharmacy*   Lab Work: NONE If you have labs (blood work) drawn today and your tests are completely normal, you will receive your results only by: [place] (if you have MyChart) OR A paper copy in the mail If you have any lab test that is abnormal or we need to change your treatment, we will call you to review the results.   Testing/Procedures: ECHO Your physician has requested that you have an echocardiogram. Echocardiography is a painless test that uses sound waves to create images of your heart. It provides your doctor with information about the size and shape of your heart and how well your hearts chambers and valves are working. This procedure takes approximately one hour. There are no restrictions for this procedure.  Coronary CT Your physician has requested that you have cardiac CT. Cardiac computed tomography (CT) is a painless test that uses an x-ray machine to take clear, detailed pictures of your heart. For further information please visit [email or web address].tn. Please follow instruction sheet as given.     Follow-Up: As needed  We recommend signing up for the patient portal called "MyChart".  Sign up information is provided on this After Visit Summary.  MyChart is used to connect with patients for Virtual Visits (Telemedicine).  Patients are able to view lab/test results, encounter notes, upcoming appointments, etc.  Non-urgent messages can be sent to your provider as well.   To learn more about what you can do with MyChart, go to [email or web address].     Provider:   Sherren Mocha   Other Instructions   Your cardiac CT will be scheduled at one of the below locations:   [facility] [address] [place], [place] [address] [phone]  Please arrive at the [facility] main  entrance (entrance A) of [facility] 30 minutes prior to test start time. You can use the FREE valet parking offered at the main entrance (encouraged to control the heart rate for the test) Proceed to the [facility] Radiology Department (first floor) to check-in and test prep.  Please follow these instructions carefully (unless otherwise directed):  Hold all erectile dysfunction medications at least 3 days (72 hrs) prior to test.  On the Night Before the Test: Be sure to Drink plenty of water. Do not consume any caffeinated/decaffeinated beverages or chocolate 12 hours prior to your test. Do not take any antihistamines 12 hours prior to your test.  On the Day of the Test: Drink plenty of water until 1 hour prior to the test. Do not eat any food 4 hours prior to the test. You may take your regular medications prior to the test.  Take metoprolol (Lopressor) two hours prior to test.   *Resting HR > 65 bpm and BP >110/50 mmHG  Metoprolol tartrate 100 mg PO 90-120 minutes prior to scan   After the Test: Drink plenty of water. After receiving IV contrast, you may experience a mild flushed feeling. This is normal. On occasion, you may experience a mild rash up to 24 hours after the test. This is not dangerous. If this occurs, you can take Benadryl 25 mg and increase your fluid intake. If you experience trouble breathing, this can be serious. If it is severe call 911 IMMEDIATELY. If it is mild, please call our office. If you take any of these medications: Glipizide/Metformin,  Avandament, Glucavance, please do not take 48 hours after completing test unless otherwise instructed.  We will call to schedule your test 2-4 weeks out understanding that some insurance companies will need an authorization prior to the service being performed.   For non-scheduling related questions, please contact the cardiac imaging nurse navigator should you have any questions/concerns: Marchia Bond, Cardiac  Imaging Nurse Navigator Gordy Clement, Cardiac Imaging Nurse Navigator  Heart and Vascular Services Direct Office Dial: [phone]   For scheduling needs, including cancellations and rescheduling, please call [place], [phone].

## 2021-09-23 NOTE — Progress Notes (Signed)
Cardiology Office Note:    Date:  09/23/2021   ID:  Joseph Melton, DOB 1951-04-12, MRN [id number]  PCP:  Cecile Sheerer, NP   [facility] Providers Cardiologist:  None     Referring MD: Eulogio Bear, NP   Chief Complaint  Patient presents with   Chest Pain    History of Present Illness:    Joseph Melton is a 71 y.o. male referred for evaluation of chest pain.  He was recently seen in the emergency department for chest pain at rest and with activity.  The patient is here with his wife today.  His daughter works in the short stay and she is conferenced in over the telephone.  The patient has about 8 months of intermittent central chest discomfort that occurs at rest and with activity.  It is most noticeable when he is sitting at rest.  There has been no pain radiating to his arms, shoulders, or jaw.  He recently did some yard work and had a sharp pain in the left chest.  There was some associated dizziness with this.  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  Work-up there was unrevealing with a nonischemic EKG and normal troponins.  He presents today for outpatient cardiology evaluation.  The patient has no family history of coronary artery disease.  He has no history of hypertension, diabetes, or hyperlipidemia.  He does have a 20-pack-year history of tobacco use but he has quit smoking.  He has had mild shortness of breath over the last 8 months.  No orthopnea or PND.  Intermittent leg swelling is noted.  Past Medical History:  Diagnosis Date   Arthritis    Diverticulitis     Past Surgical History:  Procedure Laterality Date   COLONOSCOPY     FRACTURE SURGERY Right    wrist   HERNIA REPAIR     inguinal hernia repair x 2   LUMBAR DISC SURGERY     x 2   LUMBAR FUSION     L 3, 4, 5   MENISCUS REPAIR Left    SHOULDER ARTHROSCOPY WITH ROTATOR CUFF REPAIR AND SUBACROMIAL DECOMPRESSION Left  11/06/2017   Procedure: SHOULDER ARTHROSCOPY WITH ROTATOR CUFF REPAIR AND SUBACROMIAL DECOMPRESSION;  Surgeon: Tania Ade, MD;  Location: [place];  Service: Orthopedics;  Laterality: Left;    Current Medications: Current Meds  Medication Sig   Ibuprofen 200 MG CAPS Take by mouth.   metoprolol tartrate (LOPRESSOR) 100 MG tablet Take 1 tablet by mouth 90 minutes prior to scan     Allergies:   Patient has no known allergies.   Social History   Socioeconomic History   Marital status: Married    Spouse name: Not on file   Number of children: Not on file   Years of education: Not on file   Highest education level: Not on file  Occupational History   Not on file  Tobacco Use   Smoking status: Former    Types: Cigarettes    Quit date: 06/22/2013    Years since quitting: 8.2   Smokeless tobacco: Never  Vaping Use   Vaping Use: Never used  Substance and Sexual Activity   Alcohol use: Yes    Comment: occasional (once a week)   Drug use: No   Sexual activity: Not on file  Other Topics Concern   Not on file  Social History Narrative   Not  on file   Social Determinants of Health   Financial Resource Strain: Not on file  Food Insecurity: Not on file  Transportation Needs: Not on file  Physical Activity: Not on file  Stress: Not on file  Social Connections: Not on file     Family History: The patient's family history includes Cancer in his mother and sister; Dementia in his mother; Melanoma in his father.  ROS:   Please see the history of present illness.    All other systems reviewed and are negative.  EKGs/Labs/Other Studies Reviewed:    EKG:  EKG is not ordered today.  The EKG from the emergency department 09/15/2021 demonstrates normal sinus rhythm 78 bpm with no significant abnormalities noted  Recent Labs: 09/15/2021: ALT 13; BUN 29; Creatinine, Ser 1.04; Hemoglobin 15.0; Platelets 199; Potassium 3.9; Sodium 141  Recent Lipid Panel    Component Value Date/Time    CHOL 132 09/13/2020 0000   TRIG 72 09/13/2020 0000   HDL 41 09/13/2020 0000   LDLCALC 76 09/13/2020 0000     Risk Assessment/Calculations:           Physical Exam:    VS:  BP 124/90    Pulse 76    Ht 5\' 10"  (1.778 m)    Wt 216 lb 9.6 oz (98.2 kg)    SpO2 95%    BMI 31.08 kg/m     Wt Readings from Last 3 Encounters:  09/23/21 216 lb 9.6 oz (98.2 kg)  09/15/21 215 lb (97.5 kg)  03/11/21 215 lb 3.2 oz (97.6 kg)     GEN:  Well nourished, well developed in no acute distress HEENT: Normal NECK: No JVD; No carotid bruits LYMPHATICS: No lymphadenopathy CARDIAC: RRR, no murmurs, rubs, gallops RESPIRATORY:  Clear to auscultation without rales, wheezing or rhonchi  ABDOMEN: Soft, non-tender, non-distended MUSCULOSKELETAL:  No edema; No deformity  SKIN: Warm and dry NEUROLOGIC:  Alert and oriented x 3 PSYCHIATRIC:  Normal affect   ASSESSMENT:    1. Precordial chest pain   2. Shortness of breath    PLAN:    In order of problems listed above:  The patient has chest pain with typical and atypical features, occurring in the precordial area on the left side of the sternum.  His cardiac risk factors include age 43 and tobacco use.  I have recommended a gated coronary CTA in this intermediate risk patient.  I explained different stress testing modalities and I think that a CT scan will give [place] additional anatomic information about the presence or absence of aortic atherosclerosis as well as the presence or absence of nonobstructive CAD which would impact our secondary risk reduction therapies.  We will review findings with the patient when his study is completed.  He will remain on low-dose aspirin in the interim. I have recommended a 2D echocardiogram.  His lung exam is normal.  There is some associated edema and is appropriate to assess LV and RV function as well as evaluate for the presence of any valvular heart disease and diastolic dysfunction.           Medication  Adjustments/Labs and Tests Ordered: Current medicines are reviewed at length with the patient today.  Concerns regarding medicines are outlined above.  Orders Placed This Encounter  Procedures   CT CORONARY MORPH W/CTA COR W/SCORE W/CA W/CM &/OR WO/CM   ECHOCARDIOGRAM COMPLETE   Meds ordered this encounter  Medications   metoprolol tartrate (LOPRESSOR) 100 MG tablet    Sig:  Take 1 tablet by mouth 90 minutes prior to scan    Dispense:  1 tablet    Refill:  0    One time dose    Patient Instructions  Medication Instructions:  Metoprolol Tartrate 100mg  prior to scan  *If you need a refill on your cardiac medications before your next appointment, please call your pharmacy*   Lab Work: NONE If you have labs (blood work) drawn today and your tests are completely normal, you will receive your results only by: [place] (if you have MyChart) OR A paper copy in the mail If you have any lab test that is abnormal or we need to change your treatment, we will call you to review the results.   Testing/Procedures: ECHO Your physician has requested that you have an echocardiogram. Echocardiography is a painless test that uses sound waves to create images of your heart. It provides your doctor with information about the size and shape of your heart and how well your hearts chambers and valves are working. This procedure takes approximately one hour. There are no restrictions for this procedure.  Coronary CT Your physician has requested that you have cardiac CT. Cardiac computed tomography (CT) is a painless test that uses an x-ray machine to take clear, detailed pictures of your heart. For further information please visit [email or web address]. Please follow instruction sheet as given.     Follow-Up: As needed  We recommend signing up for the patient portal called "MyChart".  Sign up information is provided on this After Visit Summary.  MyChart is used to connect with patients for  Virtual Visits (Telemedicine).  Patients are able to view lab/test results, encounter notes, upcoming appointments, etc.  Non-urgent messages can be sent to your provider as well.   To learn more about what you can do with MyChart, go to [email or web address].     Provider:   Sherren Mocha   Other Instructions   Your cardiac CT will be scheduled at one of the below locations:   [facility] [address] [place], [place] [address] [phone]  Please arrive at the [facility] main entrance (entrance A) of [facility] 30 minutes prior to test start time. You can use the FREE valet parking offered at the main entrance (encouraged to control the heart rate for the test) Proceed to the [facility] Radiology Department (first floor) to check-in and test prep.  Please follow these instructions carefully (unless otherwise directed):  Hold all erectile dysfunction medications at least 3 days (72 hrs) prior to test.  On the Night Before the Test: Be sure to Drink plenty of water. Do not consume any caffeinated/decaffeinated beverages or chocolate 12 hours prior to your test. Do not take any antihistamines 12 hours prior to your test.  On the Day of the Test: Drink plenty of water until 1 hour prior to the test. Do not eat any food 4 hours prior to the test. You may take your regular medications prior to the test.  Take metoprolol (Lopressor) two hours prior to test.   *Resting HR > 65 bpm and BP >110/50 mmHG  Metoprolol tartrate 100 mg PO 90-120 minutes prior to scan   After the Test: Drink plenty of water. After receiving IV contrast, you may experience a mild flushed feeling. This is normal. On occasion, you may experience a mild rash up to 24 hours after the test. This is not dangerous. If this occurs, you can take Benadryl 25 mg and increase  your fluid intake. If you experience trouble breathing, this can be serious. If it is severe call 911  IMMEDIATELY. If it is mild, please call our office. If you take any of these medications: Glipizide/Metformin, Avandament, Glucavance, please do not take 48 hours after completing test unless otherwise instructed.  We will call to schedule your test 2-4 weeks out understanding that some insurance companies will need an authorization prior to the service being performed.   For non-scheduling related questions, please contact the cardiac imaging nurse navigator should you have any questions/concerns: Marchia Bond, Cardiac Imaging Nurse Navigator Gordy Clement, Cardiac Imaging Nurse Navigator [facility] Direct Office Dial: [phone]   For scheduling needs, including cancellations and rescheduling, please call [place], [phone].       Signed, Sherren Mocha, MD  09/23/2021 1:12 PM    [facility]

## 2021-09-30 ENCOUNTER — Ambulatory Visit (HOSPITAL_COMMUNITY): Payer: Medicare PPO | Attending: Cardiology

## 2021-09-30 ENCOUNTER — Other Ambulatory Visit: Payer: Self-pay

## 2021-09-30 DIAGNOSIS — R0602 Shortness of breath: Secondary | ICD-10-CM | POA: Insufficient documentation

## 2021-09-30 DIAGNOSIS — R072 Precordial pain: Secondary | ICD-10-CM | POA: Insufficient documentation

## 2021-09-30 LAB — ECHOCARDIOGRAM COMPLETE
Area-P 1/2: 2.95 cm2
S' Lateral: 2.8 cm

## 2021-10-03 ENCOUNTER — Telehealth (HOSPITAL_COMMUNITY): Payer: Self-pay | Admitting: *Deleted

## 2021-10-03 ENCOUNTER — Telehealth: Payer: Self-pay

## 2021-10-03 DIAGNOSIS — R079 Chest pain, unspecified: Secondary | ICD-10-CM

## 2021-10-03 NOTE — Telephone Encounter (Signed)
Discussed with Dr. Excell Seltzer. Will add CTA to cCT tomorrow to better assess ascending aorta.  ?DPR (daugher, Lindsi), was grateful for assistance. ?

## 2021-10-03 NOTE — Telephone Encounter (Signed)
Reaching out to patient to offer assistance regarding upcoming cardiac imaging study; pt verbalizes understanding of appt date/time, parking situation and where to check in, pre-test NPO status and medications ordered, and verified current allergies; name and call back number provided for further questions should they arise ? ?Larey Brick RN Navigator Cardiac Imaging ?[facility] ?[phone] office ?[phone] cell ? ?Patient to take 100mg  metoprolol tartrate two hours prior to his cardiac CT scan. He is aware to arrive at 1:30pm for his 2pm appointment. ?

## 2021-10-04 ENCOUNTER — Ambulatory Visit (HOSPITAL_COMMUNITY)
Admission: RE | Admit: 2021-10-04 | Discharge: 2021-10-04 | Disposition: A | Discharge status: Home / Self Care | Payer: Medicare PPO | Source: Ambulatory Visit | Attending: Cardiovascular Disease | Admitting: Cardiovascular Disease

## 2021-10-04 ENCOUNTER — Other Ambulatory Visit: Payer: Self-pay

## 2021-10-04 DIAGNOSIS — R079 Chest pain, unspecified: Secondary | ICD-10-CM | POA: Diagnosis present

## 2021-10-04 DIAGNOSIS — R072 Precordial pain: Secondary | ICD-10-CM | POA: Insufficient documentation

## 2021-10-04 DIAGNOSIS — R0602 Shortness of breath: Secondary | ICD-10-CM | POA: Diagnosis present

## 2021-10-04 MED ORDER — NITROGLYCERIN 0.4 MG SL SUBL
0.8000 mg | SUBLINGUAL_TABLET | Freq: Once | SUBLINGUAL | Status: DC
Start: 2021-10-04 — End: 2021-10-05

## 2021-10-04 MED ORDER — IOHEXOL 350 MG/ML SOLN
95.0000 mL | Freq: Once | INTRAVENOUS | Status: AC | PRN
  Administered 2021-10-04: 95 mL via INTRAVENOUS

## 2021-10-04 MED ORDER — NITROGLYCERIN 0.4 MG SL SUBL
SUBLINGUAL_TABLET | SUBLINGUAL | Status: AC
  Filled 2021-10-04: qty 2

## 2021-10-06 ENCOUNTER — Telehealth: Payer: Self-pay

## 2021-10-06 DIAGNOSIS — I77810 Thoracic aortic ectasia: Secondary | ICD-10-CM

## 2021-10-06 NOTE — Telephone Encounter (Signed)
Tonny Bollman, MD  ?10/06/2021  1:45 PM EST   ?  ?4.2 cm ascending aorta. Recommend repeat CTA in one year. Otherwise no pertinent incidental findings noted.   ? ? Tonny Bollman, MD  ?10/06/2021  1:45 PM EST   ?  ?Mild nonobstructive CAD noted. Recommend medical therapy, risk reduction measures. Non-cardiac chest pain suspected. thanks  ? ?Discussed CT results with patient's daughter, DPR. ?Repeat CTA ordered for scheduling in 1 year. ?She was grateful for call and agrees with plan.  ?

## 2021-10-13 ENCOUNTER — Ambulatory Visit: Payer: Medicare PPO | Admitting: General Surgery

## 2021-11-21 DIAGNOSIS — Z791 Long term (current) use of non-steroidal anti-inflammatories (NSAID): Secondary | ICD-10-CM | POA: Diagnosis not present

## 2021-11-21 DIAGNOSIS — Z809 Family history of malignant neoplasm, unspecified: Secondary | ICD-10-CM | POA: Diagnosis not present

## 2021-11-21 DIAGNOSIS — Z87891 Personal history of nicotine dependence: Secondary | ICD-10-CM | POA: Diagnosis not present

## 2022-04-10 DIAGNOSIS — Z136 Encounter for screening for cardiovascular disorders: Secondary | ICD-10-CM | POA: Diagnosis not present

## 2022-04-10 DIAGNOSIS — R7301 Impaired fasting glucose: Secondary | ICD-10-CM | POA: Diagnosis not present

## 2022-04-10 DIAGNOSIS — Z125 Encounter for screening for malignant neoplasm of prostate: Secondary | ICD-10-CM | POA: Diagnosis not present

## 2022-04-10 DIAGNOSIS — Z139 Encounter for screening, unspecified: Secondary | ICD-10-CM | POA: Diagnosis not present

## 2022-04-17 DIAGNOSIS — I209 Angina pectoris, unspecified: Secondary | ICD-10-CM | POA: Diagnosis not present

## 2022-04-17 DIAGNOSIS — Z1211 Encounter for screening for malignant neoplasm of colon: Secondary | ICD-10-CM | POA: Diagnosis not present

## 2022-04-17 DIAGNOSIS — R102 Pelvic and perineal pain: Secondary | ICD-10-CM | POA: Diagnosis not present

## 2022-04-17 DIAGNOSIS — I700 Atherosclerosis of aorta: Secondary | ICD-10-CM | POA: Diagnosis not present

## 2022-04-17 DIAGNOSIS — Z6830 Body mass index (BMI) 30.0-30.9, adult: Secondary | ICD-10-CM | POA: Diagnosis not present

## 2022-04-17 DIAGNOSIS — R7303 Prediabetes: Secondary | ICD-10-CM | POA: Diagnosis not present

## 2022-04-17 DIAGNOSIS — Z0000 Encounter for general adult medical examination without abnormal findings: Secondary | ICD-10-CM | POA: Diagnosis not present

## 2022-04-17 DIAGNOSIS — Z125 Encounter for screening for malignant neoplasm of prostate: Secondary | ICD-10-CM | POA: Diagnosis not present

## 2022-04-17 DIAGNOSIS — I712 Thoracic aortic aneurysm, without rupture, unspecified: Secondary | ICD-10-CM | POA: Diagnosis not present

## 2022-05-30 DIAGNOSIS — Z1211 Encounter for screening for malignant neoplasm of colon: Secondary | ICD-10-CM | POA: Diagnosis not present

## 2022-07-31 HISTORY — PX: TENDON REPAIR: SHX5111

## 2022-08-01 ENCOUNTER — Ambulatory Visit
Admission: EM | Admit: 2022-08-01 | Discharge: 2022-08-01 | Disposition: A | Discharge status: Home / Self Care | Payer: Medicare PPO | Attending: Urgent Care | Admitting: Urgent Care

## 2022-08-01 ENCOUNTER — Encounter: Payer: Self-pay | Admitting: Emergency Medicine

## 2022-08-01 DIAGNOSIS — K120 Recurrent oral aphthae: Secondary | ICD-10-CM

## 2022-08-01 MED ORDER — CHLORHEXIDINE GLUCONATE 0.12 % MT SOLN: OROMUCOSAL | 0 refills | Status: DC

## 2022-08-01 MED ORDER — TRIAMCINOLONE ACETONIDE 0.1 % MT PSTE: 1.0000 | PASTE | Freq: Two times a day (BID) | OROMUCOSAL | 0 refills | Status: DC

## 2022-08-01 NOTE — ED Provider Notes (Signed)
[place] [facility]  Note:  This document was prepared using Dragon voice recognition software and may include unintentional dictation errors.  MRN: [id number] DOB: [birth year]  Subjective:   Joseph Melton is a 72 y.o. male presenting for 1 week history of persistent pain under the tongue.  Practices good dental hygiene.  No bleeding, drainage.  No dental pain.  No current facility-administered medications for this encounter.  Current Outpatient Medications:    Ibuprofen 200 MG CAPS, Take by mouth., Disp: , Rfl:    metoprolol tartrate (LOPRESSOR) 100 MG tablet, Take 1 tablet by mouth 90 minutes prior to scan, Disp: 1 tablet, Rfl: 0   No Known Allergies  Past Medical History:  Diagnosis Date   Arthritis    Diverticulitis      Past Surgical History:  Procedure Laterality Date   COLONOSCOPY     FRACTURE SURGERY Right    wrist   HERNIA REPAIR     inguinal hernia repair x 2   LUMBAR DISC SURGERY     x 2   LUMBAR FUSION     L 3, 4, 5   MENISCUS REPAIR Left    SHOULDER ARTHROSCOPY WITH ROTATOR CUFF REPAIR AND SUBACROMIAL DECOMPRESSION Left 11/06/2017   Procedure: SHOULDER ARTHROSCOPY WITH ROTATOR CUFF REPAIR AND SUBACROMIAL DECOMPRESSION;  Surgeon: Tania Ade, MD;  Location: [place];  Service: Orthopedics;  Laterality: Left;    Family History  Problem Relation Age of Onset   Cancer Mother    Dementia Mother    Melanoma Father    Cancer Sister     Social History   Tobacco Use   Smoking status: Former    Types: Cigarettes    Quit date: 06/22/2013    Years since quitting: 9.1   Smokeless tobacco: Never  Vaping Use   Vaping Use: Never used  Substance Use Topics   Alcohol use: Yes    Comment: occasional (once a week)   Drug use: No    ROS   Objective:   Vitals: BP 138/88 (BP Location: Right Arm)   Pulse 75   Temp 98.3 F (36.8 C) (Oral)   Resp 18   SpO2 94%   Physical Exam Constitutional:      General: He is not in acute distress.     Appearance: Normal appearance. He is well-developed and normal weight. He is not ill-appearing, toxic-appearing or diaphoretic.  HENT:     Head: Normocephalic and atraumatic.     Right Ear: External ear normal.     Left Ear: External ear normal.     Nose: Nose normal.     Mouth/Throat:     Pharynx: Oropharynx is clear.   Eyes:     General: No scleral icterus.       Right eye: No discharge.        Left eye: No discharge.     Extraocular Movements: Extraocular movements intact.  Cardiovascular:     Rate and Rhythm: Normal rate.  Pulmonary:     Effort: Pulmonary effort is normal.  Musculoskeletal:     Cervical back: Normal range of motion.  Neurological:     Mental Status: He is alert and oriented to person, place, and time.  Psychiatric:        Mood and Affect: Mood normal.        Behavior: Behavior normal.        Thought Content: Thought content normal.        Judgment: Judgment normal.  Assessment and Plan :   PDMP not reviewed this encounter.  1. Aphthous ulcer     Recommended triamcinolone paste directly to the aphthous ulcer.  Use chlorhexidine rinse as supplemental treatment. Counseled patient on potential for adverse effects with medications prescribed/recommended today, ER and return-to-clinic precautions discussed, patient verbalized understanding.    Jaynee Eagles, [place] 08/01/22 [phone]

## 2022-08-01 NOTE — ED Triage Notes (Signed)
States it feels like he has a splinter stuck in tongue x 1 week.  Hurts to talk and eat

## 2022-09-29 ENCOUNTER — Other Ambulatory Visit: Payer: Self-pay | Admitting: Cardiovascular Disease

## 2022-09-29 DIAGNOSIS — Z01810 Encounter for preprocedural cardiovascular examination: Secondary | ICD-10-CM

## 2022-09-29 NOTE — Progress Notes (Signed)
Updated BMET needed for CT scan to be completed per [facility] team.

## 2022-10-09 ENCOUNTER — Ambulatory Visit: Payer: Medicare PPO | Attending: Cardiovascular Disease

## 2022-10-09 DIAGNOSIS — Z01810 Encounter for preprocedural cardiovascular examination: Secondary | ICD-10-CM

## 2022-10-10 LAB — BASIC METABOLIC PANEL
BUN/Creatinine Ratio: 28 — ABNORMAL HIGH (ref 10–24)
BUN: 27 mg/dL (ref 8–27)
CO2: 23 mmol/L (ref 20–29)
Calcium: 9.1 mg/dL (ref 8.6–10.2)
Chloride: 106 mmol/L (ref 96–106)
Creatinine, Ser: 0.97 mg/dL (ref 0.76–1.27)
Glucose: 90 mg/dL (ref 70–99)
Potassium: 4.6 mmol/L (ref 3.5–5.2)
Sodium: 142 mmol/L (ref 134–144)
eGFR: 83 mL/min/{1.73_m2} (ref >59)

## 2022-10-13 ENCOUNTER — Ambulatory Visit (HOSPITAL_BASED_OUTPATIENT_CLINIC_OR_DEPARTMENT_OTHER)
Admission: RE | Admit: 2022-10-13 | Discharge: 2022-10-13 | Disposition: A | Discharge status: Home / Self Care | Payer: Medicare PPO | Source: Ambulatory Visit | Attending: Cardiovascular Disease | Admitting: Cardiovascular Disease

## 2022-10-13 DIAGNOSIS — I77810 Thoracic aortic ectasia: Secondary | ICD-10-CM | POA: Diagnosis not present

## 2022-10-13 DIAGNOSIS — I2510 Atherosclerotic heart disease of native coronary artery without angina pectoris: Secondary | ICD-10-CM | POA: Diagnosis not present

## 2022-10-13 MED ORDER — IOHEXOL 350 MG/ML SOLN
100.0000 mL | Freq: Once | INTRAVENOUS | Status: AC | PRN
  Administered 2022-10-13: 100 mL via INTRAVENOUS

## 2022-11-13 IMAGING — CT CT HEART MORP W/ CTA COR W/ SCORE W/ CA W/CM &/OR W/O CM
1 of 6 series · 4 of 20 positions shown, 5 images · IV contrast (APPLIED)
Comparison: None.
COMPARISON: None.

Addendum:
EXAM:
OVER-READ INTERPRETATION  CT CHEST

The following report is an over-read performed by radiologist Dr.
Mando Lowther [REDACTED] on 10/04/2021. This
over-read does not include interpretation of cardiac or coronary
anatomy or pathology. The coronary calcium score/coronary CTA
interpretation by the cardiologist is attached.
CLINICAL DATA: Chest pain
Cardiac CTA
MEDICATIONS:
Sub lingual nitro. 4mg x 2
TECHNIQUE: The patient was scanned on a Siemens [REDACTED]ice scanner. Gantry
rotation speed was 250 msecs. Collimation was 0.6 mm. A 100 kV
prospective scan was triggered in the ascending thoracic aorta at
35-75% of the R-R interval. Average HR during the scan was 60 bpm.
The 3D data set was interpreted on a dedicated work station using
MPR, MIP and VRT modes. A total of 80cc of contrast was used.

[Series 6: ts diast · axial · 0.37mm/px · z∈[+993,+1106]mm · 4 of 469 slices shown, 5 images]
[im 94/469  vessel]
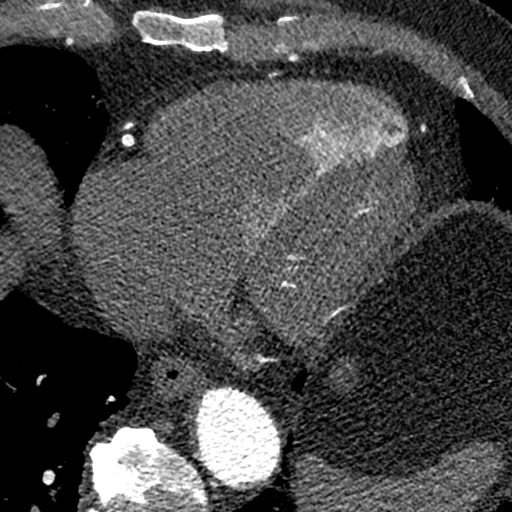
[im 94/469  lung]
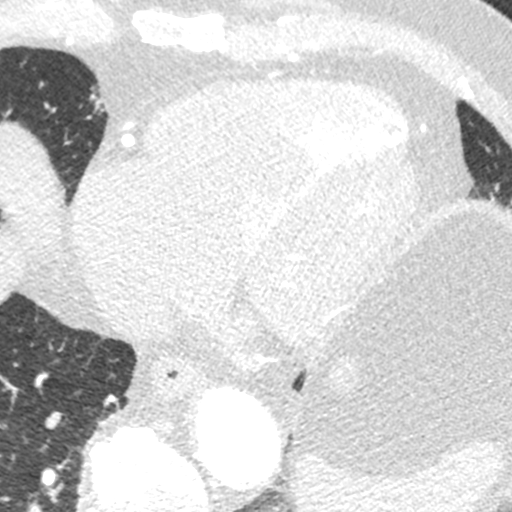
[im 188/469  vessel]
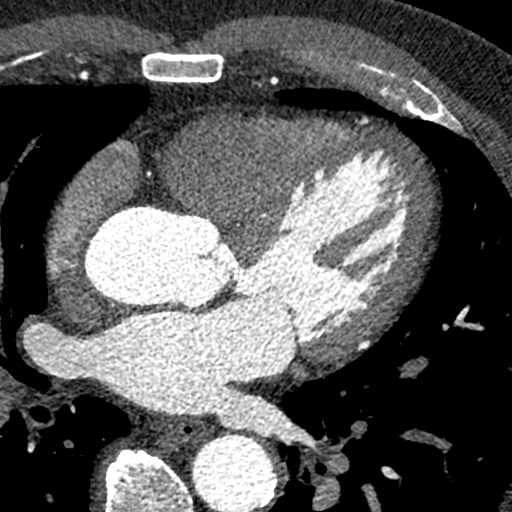
[im 281/469  vessel]
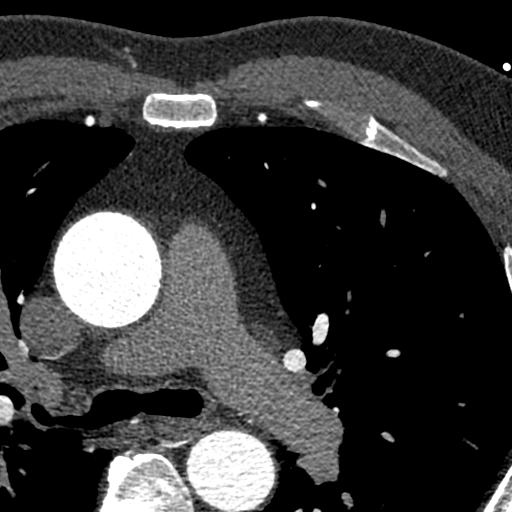
[im 375/469  vessel]
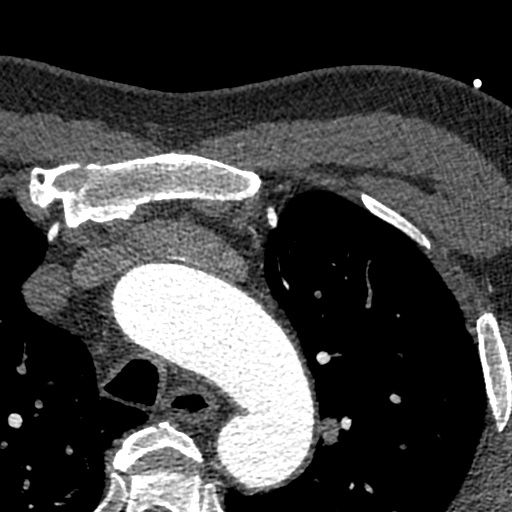

[4 of 20 positions shown; findings below may reference images not displayed]

FINDINGS: Vascular: Ascending aorta measures 4.2 cm.

Mediastinum/Nodes: None.

Lungs/Pleura: Limited view is unremarkable.

Upper Abdomen: 1.3 cm low-attenuation lesion left hepatic lobe is
too small to characterize. Liver may be decreased in attenuation
diffusely.

Musculoskeletal: Degenerative changes in the spine.
IMPRESSION: Ascending aortic aneurysm. Recommend annual imaging followup by CTA
or MRA. This recommendation follows 9848
ACCF/AHA/AATS/ACR/ASA/SCA/NITIN/PRENN/QUIRIJN/TARRICK Guidelines for the
Diagnosis and Management of Patients with Thoracic Aortic Disease.
Circulation. 9848; 121: E266-e369. Aortic aneurysm NOS
(2RCLO-XRF.I).

CT angiography chest dictated separately.
FINDINGS: Non-cardiac: See separate report from [REDACTED].

No LA appendage thrombus noted. The pulmonary veins drain normally
to the left atrium.

Ascending aorta was dilated to 42 mm.

Calcium Score: 76 Agatston units.

Coronary Arteries: Right dominant with no anomalies

LM: No plaque or stenosis.

LAD system: Calcified plaque in the proximal LAD with mild (1-24%)
stenosis.

Circumflex system: Calcified plaque in the mid LCx with mid (1-24%)
stenosis.

RCA system: No plaque or stenosis.
IMPRESSION: 1.  Mild ascending aorta aneurysm (42 mm).  Suggest annual followup.

2. Coronary artery calcium score 76 Agatston units. This places the
patient in the 37th percentile for age and gender, suggesting low
risk for future cardiac events.

3.  Mild, nonobstructive CAD.

[place] Tgbf

*** End of Addendum ***
EXAM:
OVER-READ INTERPRETATION  CT CHEST

The following report is an over-read performed by radiologist Dr.
Mando Lowther [REDACTED] on 10/04/2021. This
over-read does not include interpretation of cardiac or coronary
anatomy or pathology. The coronary calcium score/coronary CTA
interpretation by the cardiologist is attached.
FINDINGS: Vascular: Ascending aorta measures 4.2 cm.

Mediastinum/Nodes: None.

Lungs/Pleura: Limited view is unremarkable.

Upper Abdomen: 1.3 cm low-attenuation lesion left hepatic lobe is
too small to characterize. Liver may be decreased in attenuation
diffusely.

Musculoskeletal: Degenerative changes in the spine.
IMPRESSION: Ascending aortic aneurysm. Recommend annual imaging followup by CTA
or MRA. This recommendation follows 9848
ACCF/AHA/AATS/ACR/ASA/SCA/NITIN/PRENN/QUIRIJN/TARRICK Guidelines for the
Diagnosis and Management of Patients with Thoracic Aortic Disease.
Circulation. 9848; 121: [id number]. Aortic aneurysm NOS
(2RCLO-XRF.I).

CT angiography chest dictated separately.

## 2022-11-15 ENCOUNTER — Ambulatory Visit (INDEPENDENT_AMBULATORY_CARE_PROVIDER_SITE_OTHER): Payer: Medicare PPO

## 2022-11-15 ENCOUNTER — Ambulatory Visit: Payer: Medicare PPO | Admitting: Podiatry

## 2022-11-15 DIAGNOSIS — S86312A Strain of muscle(s) and tendon(s) of peroneal muscle group at lower leg level, left leg, initial encounter: Secondary | ICD-10-CM | POA: Diagnosis not present

## 2022-11-15 DIAGNOSIS — M79672 Pain in left foot: Secondary | ICD-10-CM

## 2022-11-15 NOTE — Progress Notes (Signed)
Chief Complaint  Patient presents with   Foot Pain    Patient came in today for left foot arch pain, started a month ago, patient stated that he had a knot that would come up at night time, 2 week ago felt a ripe in the arch,  some swelling, rate of pain 3 out of 10, burning, X-Rays done today     HPI: 72 y.o. male PMHx arthritis presenting today as a new patient for evaluation of an injury that was sustained about 3 weeks ago at [employer].  Patient states that he stepped into a ride and heard an audible pop with immediate pain along the plantar arch of the left foot extending laterally into the ankle.  Patient noticed immediate swelling with the injury.  He was unable to bear weight without pain.  Over the past 3 weeks he has continued to have pain and tenderness with swelling of the foot and ankle.  Limited ability to plantarflex the toes as well.  Patient has been icing the foot with minimal relief.  Other than ice he has not done anything for treatment  Past Medical History:  Diagnosis Date   Arthritis    Diverticulitis     Past Surgical History:  Procedure Laterality Date   COLONOSCOPY     FRACTURE SURGERY Right    wrist   HERNIA REPAIR     inguinal hernia repair x 2   LUMBAR DISC SURGERY     x 2   LUMBAR FUSION     L 3, 4, 5   MENISCUS REPAIR Left    SHOULDER ARTHROSCOPY WITH ROTATOR CUFF REPAIR AND SUBACROMIAL DECOMPRESSION Left 11/06/2017   Procedure: SHOULDER ARTHROSCOPY WITH ROTATOR CUFF REPAIR AND SUBACROMIAL DECOMPRESSION;  Surgeon: Jones Broom, MD;  Location: [facility];  Service: Orthopedics;  Laterality: Left;    No Known Allergies   Physical Exam: General: The patient is alert and oriented x3 in no acute distress.  Dermatology: Skin is warm, dry and supple bilateral lower extremities.  No open wounds  Vascular: Skin is warm to touch.  Pulses palpable.  Capillary refill immediate.  There is moderate edema noted throughout the foot and ankle, especially the  lateral aspect of the ankle  Neurological: Grossly intact via light touch  Musculoskeletal Exam: No pedal deformities noted.  Gross alignment of the foot noted.  Loss of ability to plantarflex the toes as well as limited ability for ankle joint dorsiflexion and eversion.  There is tenderness with palpation along the peroneus longus tendon beginning just posterior to the lateral malleolus extending distally down into the arch of the foot traveling along the course of the peroneus longus.  High suspicion for peroneus longus rupture based on clinical exam and patient's given history of injury  Radiographic Exam LT foot and ankle 11/14/2021:  Normal osseous mineralization. Joint spaces preserved and in gross alignment.  No acute fractures identified.  There is an os peroneum noted just proximal to the fifth metatarsal tubercle.  Assessment/Plan of Care: 1.  High suspicion for peroneus longus tendon tear LLE  -Patient evaluated.  X-rays reviewed -Based on the clinical exam I do believe that the patient has suffered or sustained a tendon injury, likely the peroneal tendons.  MRI is warranted given the history of acute injury and immediate onset of pain with swelling. -MRI ordered LT foot and ankle -Patient has a cam boot at home.  WBAT -Ace wrap applied.  Continue Ace wrap daily -Follow-up in office after  MRI is available to review the results and discuss further treatment options  *Lindsi Forte's Father. She is the [facility] Periop Nurse Manager       Felecia Shelling, DPM [facility]  Dr. Felecia Shelling, DPM    [address] [place], [place] [address]                Office [phone]  Fax [phone]

## 2022-11-17 ENCOUNTER — Ambulatory Visit: Payer: Self-pay | Admitting: Podiatry

## 2022-11-17 ENCOUNTER — Ambulatory Visit: Payer: Medicare PPO | Admitting: Podiatry

## 2022-11-27 ENCOUNTER — Other Ambulatory Visit: Payer: Self-pay | Admitting: Podiatry

## 2022-11-27 DIAGNOSIS — M79672 Pain in left foot: Secondary | ICD-10-CM

## 2022-11-27 DIAGNOSIS — S86312A Strain of muscle(s) and tendon(s) of peroneal muscle group at lower leg level, left leg, initial encounter: Secondary | ICD-10-CM

## 2022-12-23 ENCOUNTER — Other Ambulatory Visit: Payer: Medicare PPO

## 2023-01-04 DIAGNOSIS — Z833 Family history of diabetes mellitus: Secondary | ICD-10-CM | POA: Diagnosis not present

## 2023-01-04 DIAGNOSIS — Z803 Family history of malignant neoplasm of breast: Secondary | ICD-10-CM | POA: Diagnosis not present

## 2023-01-04 DIAGNOSIS — E669 Obesity, unspecified: Secondary | ICD-10-CM | POA: Diagnosis not present

## 2023-01-04 DIAGNOSIS — N182 Chronic kidney disease, stage 2 (mild): Secondary | ICD-10-CM | POA: Diagnosis not present

## 2023-01-04 DIAGNOSIS — Z6831 Body mass index (BMI) 31.0-31.9, adult: Secondary | ICD-10-CM | POA: Diagnosis not present

## 2023-01-04 DIAGNOSIS — M1990 Unspecified osteoarthritis, unspecified site: Secondary | ICD-10-CM | POA: Diagnosis not present

## 2023-01-04 DIAGNOSIS — Z791 Long term (current) use of non-steroidal anti-inflammatories (NSAID): Secondary | ICD-10-CM | POA: Diagnosis not present

## 2023-01-04 DIAGNOSIS — Z87891 Personal history of nicotine dependence: Secondary | ICD-10-CM | POA: Diagnosis not present

## 2023-01-08 ENCOUNTER — Encounter: Payer: Self-pay | Admitting: Podiatry

## 2023-01-09 ENCOUNTER — Ambulatory Visit
Admission: RE | Admit: 2023-01-09 | Discharge: 2023-01-09 | Disposition: A | Discharge status: Home / Self Care | Payer: Medicare PPO | Source: Ambulatory Visit | Attending: Podiatry | Admitting: Podiatry

## 2023-01-09 DIAGNOSIS — M7989 Other specified soft tissue disorders: Secondary | ICD-10-CM | POA: Diagnosis not present

## 2023-01-09 DIAGNOSIS — S86312A Strain of muscle(s) and tendon(s) of peroneal muscle group at lower leg level, left leg, initial encounter: Secondary | ICD-10-CM

## 2023-01-24 ENCOUNTER — Ambulatory Visit: Payer: Medicare PPO | Admitting: Podiatry

## 2023-01-24 ENCOUNTER — Telehealth: Payer: Self-pay | Admitting: Podiatry

## 2023-01-24 DIAGNOSIS — S86312A Strain of muscle(s) and tendon(s) of peroneal muscle group at lower leg level, left leg, initial encounter: Secondary | ICD-10-CM

## 2023-01-24 NOTE — Progress Notes (Signed)
Chief Complaint  Patient presents with   Foot Pain    Patient came in today for left foot tendon tear, follow-up, MRI results,     HPI: 72 y.o. male PMHx arthritis presenting today for follow-up evaluation of peroneal tendon injury to the left foot.  Patient states that it took a significant time to get the MRI completed.  Patient continues to have pain and tenderness with swelling of the foot and ankle.  Limited ability to plantarflex the toes as well.  Patient has been icing the foot with minimal relief.  Other than ice he has not done anything for treatment.  Presenting to review the MRI results and discuss further treatment options   Brief history: Injury was sustained March 2024 at [facility].  Patient states that he stepped into a ride and heard an audible pop with immediate pain along the plantar arch of the left foot extending laterally into the ankle.  Patient noticed immediate swelling with the injury.  He was unable to bear weight without pain.    Past Medical History:  Diagnosis Date   Arthritis    Diverticulitis     Past Surgical History:  Procedure Laterality Date   COLONOSCOPY     FRACTURE SURGERY Right    wrist   HERNIA REPAIR     inguinal hernia repair x 2   LUMBAR DISC SURGERY     x 2   LUMBAR FUSION     L 3, 4, 5   MENISCUS REPAIR Left    SHOULDER ARTHROSCOPY WITH ROTATOR CUFF REPAIR AND SUBACROMIAL DECOMPRESSION Left 11/06/2017   Procedure: SHOULDER ARTHROSCOPY WITH ROTATOR CUFF REPAIR AND SUBACROMIAL DECOMPRESSION;  Surgeon: Jones Broom, MD;  Location: [facility] OR;  Service: Orthopedics;  Laterality: Left;    No Known Allergies   Physical Exam: General: The patient is alert and oriented x3 in no acute distress.  Dermatology: Skin is warm, dry and supple bilateral lower extremities.  No open wounds  Vascular: Skin is warm to touch.  Pulses palpable.  Capillary refill immediate.  There is moderate edema noted throughout the foot and ankle, especially the  lateral aspect of the ankle  Neurological: Grossly intact via light touch  Musculoskeletal Exam: Unchanged.  No pedal deformities noted.  Gross alignment of the foot noted.  Loss of ability to plantarflex the toes as well as limited ability for ankle joint dorsiflexion and eversion.  There is tenderness with palpation along the peroneus longus tendon beginning just posterior to the lateral malleolus extending distally down into the arch of the foot traveling along the course of the peroneus longus.  High suspicion for peroneus longus rupture based on clinical exam and patient's given history of injury  Radiographic Exam LT foot and ankle 11/14/2021:  Normal osseous mineralization. Joint spaces preserved and in gross alignment.  No acute fractures identified.  There is an os peroneum noted just proximal to the fifth metatarsal tubercle.  MR ANKLE LEFT WO CONTRAST 01/09/2023 IMPRESSION: 1. Full-thickness tear of the peroneus longus tendon as it passes along the inferior aspect of the calcaneocuboid joint. 2. Partial longitudinal split tear of the peroneus brevis tendon at the level of the lateral malleolus. 3. The additional ankle tendons and ligaments appear intact. 4. Mild degenerative changes laterally in the tibiotalar joint and midfoot. No acute osseous findings. 5. Nonspecific generalized subcutaneous edema surrounding the ankle and extending into the dorsal aspect of the midfoot.  MR FOOT LEFT WO CONTRAST 01/09/2023 IMPRESSION: 1. Peroneus longus  tendinosis within the plantar aspect of the forefoot. The tendon is torn more proximally; see separate ankle/hindfoot report. 2. Mild degenerative changes at the 1st metatarsophalangeal joint associated with a mild hallux valgus deformity. 3. No acute osseous findings. 4. Ankle and hindfoot findings are dictated separately.  Assessment/Plan of Care: 1.  Rupture peroneus longus left ankle 2.  Partial longitudinal split tear peroneus  brevis  -Patient evaluated.  MRI reviewed -Today we discussed the need for primary repair of the peroneus longus tendon rupture.  Repair is definitely recommended.  Risk benefits advantages and disadvantages of the procedure were explained in detail to the patient.  The procedure was also explained in detail which would include primary repair of the peroneus longus and brevis tendon tears with tenodesis of the peroneal tendons.  Postoperative recovery course was also explained in detail.  Patient understands that he will be nonweightbearing for about 4-6 weeks postoperatively using a knee scooter and below knee cast -Authorization for surgery was initiated today.  Surgery will consist of primary repair of peroneal tendon tears left.  Peroneal tenodesis left.  Application of Integra Tenoglide.  -In the meantime, Continue WBAT cam boot -Return to clinic 1 week postop  *Lindsi Forte's Father. She is the [facility] Periop Nurse Manager       Felecia Shelling, DPM [facility]  Dr. Felecia Shelling, DPM    [address] [place], [place] [address]                Office [phone]  Fax [phone]

## 2023-01-24 NOTE — Telephone Encounter (Signed)
CALLED PT TO SCHEDULE SX. HE STATES THAT HIS WIFE IS HAVING SURGERY AND WANT TO WAIT AND SCHEDULE HIS AFTER SHE HAS HERS. HE STATES THAT HE WILL CALL NEXT WEEK.

## 2023-02-13 ENCOUNTER — Telehealth: Payer: Self-pay | Admitting: Podiatry

## 2023-02-13 NOTE — Telephone Encounter (Signed)
DOS - 02/22/2023  Repair Post Tical Tendon/Secondary LT - [phone]  [employer] Effective Date - 07/31/2021  DED - N/A OOP - $4000 w/ $3810 Remaining COINS - 0%  Per [employer] Website for [employer], Doesn't require submission in most cases for CPT code 59563.   Reference number [id number]

## 2023-02-14 DIAGNOSIS — R7303 Prediabetes: Secondary | ICD-10-CM | POA: Diagnosis not present

## 2023-02-14 DIAGNOSIS — I2510 Atherosclerotic heart disease of native coronary artery without angina pectoris: Secondary | ICD-10-CM | POA: Diagnosis not present

## 2023-02-14 DIAGNOSIS — Z01818 Encounter for other preprocedural examination: Secondary | ICD-10-CM | POA: Diagnosis not present

## 2023-02-14 DIAGNOSIS — Z01812 Encounter for preprocedural laboratory examination: Secondary | ICD-10-CM | POA: Diagnosis not present

## 2023-02-22 ENCOUNTER — Other Ambulatory Visit: Payer: Self-pay | Admitting: Podiatry

## 2023-02-22 DIAGNOSIS — G8918 Other acute postprocedural pain: Secondary | ICD-10-CM | POA: Diagnosis not present

## 2023-02-22 DIAGNOSIS — S86312A Strain of muscle(s) and tendon(s) of peroneal muscle group at lower leg level, left leg, initial encounter: Secondary | ICD-10-CM | POA: Diagnosis not present

## 2023-02-22 DIAGNOSIS — M66272 Spontaneous rupture of extensor tendons, left ankle and foot: Secondary | ICD-10-CM | POA: Diagnosis not present

## 2023-02-22 MED ORDER — IBUPROFEN 800 MG PO TABS: 800.0000 mg | ORAL_TABLET | Freq: Three times a day (TID) | ORAL | 1 refills | Status: DC

## 2023-02-22 MED ORDER — OXYCODONE-ACETAMINOPHEN 5-325 MG PO TABS: 1.0000 | ORAL_TABLET | ORAL | 0 refills | Status: DC | PRN

## 2023-02-22 NOTE — Progress Notes (Signed)
PRN postop 

## 2023-02-28 ENCOUNTER — Ambulatory Visit (INDEPENDENT_AMBULATORY_CARE_PROVIDER_SITE_OTHER): Payer: Medicare PPO | Admitting: Podiatry

## 2023-02-28 DIAGNOSIS — Z9889 Other specified postprocedural states: Secondary | ICD-10-CM

## 2023-02-28 DIAGNOSIS — S86312A Strain of muscle(s) and tendon(s) of peroneal muscle group at lower leg level, left leg, initial encounter: Secondary | ICD-10-CM

## 2023-02-28 NOTE — Progress Notes (Signed)
  Subjective:  Patient ID: Joseph Melton, male    DOB: 1950/10/25,  MRN: [id number]  Chief Complaint  Patient presents with   Routine Post Op    DOS: 02/22/2023 Procedure: Right primary repair of the peroneal tendon  72 y.o. male returns for post-op check.  He states that he is doing well.  He is known to Dr. Roxan Hockey to the surgery.  Bandages clean dry and intact nonweightbearing to the right lower extremity  Review of Systems: Negative except as noted in the HPI. Denies N/V/F/Ch.  Past Medical History:  Diagnosis Date   Arthritis    Diverticulitis     Current Outpatient Medications:    chlorhexidine (PERIDEX) 0.12 % solution, Rinse mouth with 15mL for 30 seconds twice daily., Disp: 500 mL, Rfl: 0   ibuprofen (ADVIL) 800 MG tablet, Take 1 tablet (800 mg total) by mouth 3 (three) times daily., Disp: 60 tablet, Rfl: 1   metoprolol tartrate (LOPRESSOR) 100 MG tablet, Take 1 tablet by mouth 90 minutes prior to scan (Patient not taking: Reported on 11/15/2022), Disp: 1 tablet, Rfl: 0   oxyCODONE-acetaminophen (PERCOCET) 5-325 MG tablet, Take 1 tablet by mouth every 4 (four) hours as needed for severe pain., Disp: 30 tablet, Rfl: 0   triamcinolone (KENALOG) 0.1 % paste, Use as directed 1 Application in the mouth or throat 2 (two) times daily., Disp: 5 g, Rfl: 0  Social History   Tobacco Use  Smoking Status Former   Current packs/day: 0.00   Types: Cigarettes   Quit date: 06/22/2013   Years since quitting: 9.7  Smokeless Tobacco Never    No Known Allergies Objective:  There were no vitals filed for this visit. There is no height or weight on file to calculate BMI. Constitutional Well developed. Well nourished.  Vascular Foot warm and well perfused. Capillary refill normal to all digits.   Neurologic Normal speech. Oriented to person, place, and time. Epicritic sensation to light touch grossly present bilaterally.  Dermatologic Skin healing well without signs of infection.  Skin edges well coapted without signs of infection.  Orthopedic: Tenderness to palpation noted about the surgical site.   Radiographs: None Assessment:   1. Traumatic rupture of peroneal tendon of left foot, initial encounter   2. Status post foot surgery    Plan:  Patient was evaluated and treated and all questions answered.  S/p foot surgery right -Progressing as expected post-operatively. -XR: See above -WB Status: Nonweightbearing in right lower extremity -Sutures: Intact.  No clinical signs of dehiscence noted no complication noted. -Medications: None -Foot redressed.  No follow-ups on file.

## 2023-03-05 ENCOUNTER — Encounter: Payer: Medicare PPO | Admitting: Podiatry

## 2023-03-09 ENCOUNTER — Ambulatory Visit (INDEPENDENT_AMBULATORY_CARE_PROVIDER_SITE_OTHER): Payer: Medicare PPO | Admitting: Podiatry

## 2023-03-09 DIAGNOSIS — S86312A Strain of muscle(s) and tendon(s) of peroneal muscle group at lower leg level, left leg, initial encounter: Secondary | ICD-10-CM

## 2023-03-09 DIAGNOSIS — Z9889 Other specified postprocedural states: Secondary | ICD-10-CM

## 2023-03-09 NOTE — Progress Notes (Signed)
  Subjective:  Patient ID: Joseph Melton, male    DOB: 02/20/1951,  MRN: [id number]  Chief Complaint  Patient presents with   Routine Post Op    DOS: 02/22/2023 Procedure: Right primary repair of the peroneal tendon  72 y.o. male returns for post-op check.  He states that he is doing well.  He is known to Dr. Logan Bores to the surgery.  Bandages clean dry and intact nonweightbearing to the right lower extremity  Review of Systems: Negative except as noted in the HPI. Denies N/V/F/Ch.  Past Medical History:  Diagnosis Date   Arthritis    Diverticulitis     Current Outpatient Medications:    chlorhexidine (PERIDEX) 0.12 % solution, Rinse mouth with 15mL for 30 seconds twice daily., Disp: 500 mL, Rfl: 0   ibuprofen (ADVIL) 800 MG tablet, Take 1 tablet (800 mg total) by mouth 3 (three) times daily., Disp: 60 tablet, Rfl: 1   metoprolol tartrate (LOPRESSOR) 100 MG tablet, Take 1 tablet by mouth 90 minutes prior to scan (Patient not taking: Reported on 11/15/2022), Disp: 1 tablet, Rfl: 0   oxyCODONE-acetaminophen (PERCOCET) 5-325 MG tablet, Take 1 tablet by mouth every 4 (four) hours as needed for severe pain., Disp: 30 tablet, Rfl: 0   triamcinolone (KENALOG) 0.1 % paste, Use as directed 1 Application in the mouth or throat 2 (two) times daily., Disp: 5 g, Rfl: 0  Social History   Tobacco Use  Smoking Status Former   Current packs/day: 0.00   Types: Cigarettes   Quit date: 06/22/2013   Years since quitting: 9.7  Smokeless Tobacco Never    No Known Allergies Objective:  There were no vitals filed for this visit. There is no height or weight on file to calculate BMI. Constitutional Well developed. Well nourished.  Vascular Foot warm and well perfused. Capillary refill normal to all digits.   Neurologic Normal speech. Oriented to person, place, and time. Epicritic sensation to light touch grossly present bilaterally.  Dermatologic Skin healing well without signs of infection. Skin  edges well coapted without signs of infection.  Orthopedic: Tenderness to palpation noted about the surgical site.   Radiographs: None Assessment:   1. Traumatic rupture of peroneal tendon of left foot, initial encounter   2. Status post foot surgery     Plan:  Patient was evaluated and treated and all questions answered.  S/p foot surgery right -Progressing as expected post-operatively. -XR: See above -WB Status: Nonweightbearing in right lower extremity -Sutures: Staples removed no clinical signs of dehiscence noted no complication noted. -Medications: None -Continue nonweightbearing to the right lower extremity.  He will see Dr. Logan Bores for further postop clearance.  No follow-ups on file.

## 2023-03-21 ENCOUNTER — Ambulatory Visit: Payer: Medicare PPO | Admitting: Podiatry

## 2023-03-21 DIAGNOSIS — Z9889 Other specified postprocedural states: Secondary | ICD-10-CM

## 2023-03-21 DIAGNOSIS — S86312A Strain of muscle(s) and tendon(s) of peroneal muscle group at lower leg level, left leg, initial encounter: Secondary | ICD-10-CM

## 2023-03-21 NOTE — Progress Notes (Signed)
   Chief Complaint  Patient presents with   Routine Post Op    POV #3 Left ankle peroneal tendon repair, doing well, no pain    Subjective:  Patient presents today status post primary repair of the peroneal tendons left lower extremity.  DOS: 02/22/2023.  Patient doing well.  Incision is nicely healed.  He has mostly been NWB in the cam boot with the assistance of the knee scooter but he does admit to applying some pressure in the boot.  No new complaints  Past Medical History:  Diagnosis Date   Arthritis    Diverticulitis     Past Surgical History:  Procedure Laterality Date   COLONOSCOPY     FRACTURE SURGERY Right    wrist   HERNIA REPAIR     inguinal hernia repair x 2   LUMBAR DISC SURGERY     x 2   LUMBAR FUSION     L 3, 4, 5   MENISCUS REPAIR Left    SHOULDER ARTHROSCOPY WITH ROTATOR CUFF REPAIR AND SUBACROMIAL DECOMPRESSION Left 11/06/2017   Procedure: SHOULDER ARTHROSCOPY WITH ROTATOR CUFF REPAIR AND SUBACROMIAL DECOMPRESSION;  Surgeon: Jones Broom, MD;  Location: [facility] OR;  Service: Orthopedics;  Laterality: Left;    No Known Allergies  Objective/Physical Exam Neurovascular status intact.  Incision nicely healed.  No sign of infectious process noted. No dehiscence. No active bleeding noted.  Moderate edema noted to the surgical extremity.  Assessment: 1. s/p primary repair peroneal tendon rupture left. DOS: 02/22/2023   Plan of Care:  -Patient was evaluated.  -Stressed the importance of continuing to wear the cam boot.  Minimal WBAT in the cam boot.  If he is going to be on his feet or up and moving for prolonged period of time recommend knee scooter -Recommend Ace wrap daily -Return to clinic in 3 weeks.  At this time we we will discuss possible physical therapy, although the patient is not very excited to pursue physical therapy option.  In 3 weeks he may be full weightbearing in the cam boot and we will discuss transitioning him back into tennis shoes with an  ankle brace  Felecia Shelling, DPM [facility]  Dr. Felecia Shelling, DPM    [address] [place], [place] [address]                Office [phone]  Fax [phone]

## 2023-04-06 DIAGNOSIS — R42 Dizziness and giddiness: Secondary | ICD-10-CM | POA: Diagnosis not present

## 2023-04-06 DIAGNOSIS — J029 Acute pharyngitis, unspecified: Secondary | ICD-10-CM | POA: Diagnosis not present

## 2023-04-06 DIAGNOSIS — Z87891 Personal history of nicotine dependence: Secondary | ICD-10-CM | POA: Diagnosis not present

## 2023-04-06 DIAGNOSIS — R030 Elevated blood-pressure reading, without diagnosis of hypertension: Secondary | ICD-10-CM | POA: Diagnosis not present

## 2023-04-06 DIAGNOSIS — H109 Unspecified conjunctivitis: Secondary | ICD-10-CM | POA: Diagnosis not present

## 2023-04-09 ENCOUNTER — Ambulatory Visit (INDEPENDENT_AMBULATORY_CARE_PROVIDER_SITE_OTHER): Payer: Medicare PPO | Admitting: Podiatry

## 2023-04-09 ENCOUNTER — Encounter: Payer: Self-pay | Admitting: Podiatry

## 2023-04-09 DIAGNOSIS — S86312A Strain of muscle(s) and tendon(s) of peroneal muscle group at lower leg level, left leg, initial encounter: Secondary | ICD-10-CM

## 2023-04-11 NOTE — Progress Notes (Signed)
   Chief Complaint  Patient presents with   Routine Post Op    "It's great."    Subjective:  Patient presents today status post primary repair of the peroneal tendons left lower extremity.  DOS: 02/22/2023.  Patient doing well.  Patient is full weightbearing in the cam boot and he states that he is actually been getting out of the cam boot around the house.  He is getting around with no complaints and no pain.  Past Medical History:  Diagnosis Date   Arthritis    Diverticulitis     Past Surgical History:  Procedure Laterality Date   COLONOSCOPY     FRACTURE SURGERY Right    wrist   HERNIA REPAIR     inguinal hernia repair x 2   LUMBAR DISC SURGERY     x 2   LUMBAR FUSION     L 3, 4, 5   MENISCUS REPAIR Left    SHOULDER ARTHROSCOPY WITH ROTATOR CUFF REPAIR AND SUBACROMIAL DECOMPRESSION Left 11/06/2017   Procedure: SHOULDER ARTHROSCOPY WITH ROTATOR CUFF REPAIR AND SUBACROMIAL DECOMPRESSION;  Surgeon: Jones Broom, MD;  Location: [facility] OR;  Service: Orthopedics;  Laterality: Left;    No Known Allergies  Objective/Physical Exam Neurovascular status intact.  Incision nicely healed.  No sign of infectious process noted. No dehiscence. No active bleeding noted.  Moderate edema noted to the surgical extremity.  Muscle strength 5/5 all compartments  Assessment: 1. s/p primary repair peroneal tendon rupture left. DOS: 02/22/2023   Plan of Care:  -Patient was evaluated.  - Patient is doing very well.  He may now transition out of the cam boot into good supportive tennis shoes and sneakers -Recommend ankle brace.  Ankle brace dispensed today.  Wear daily -Patient may slowly increase to full activity no restrictions -Return to clinic as needed  Felecia Shelling, DPM [facility]  Dr. Felecia Shelling, DPM    [address] [place], [place] [address]                Office [phone]  Fax [phone]

## 2023-08-09 DIAGNOSIS — J069 Acute upper respiratory infection, unspecified: Secondary | ICD-10-CM | POA: Diagnosis not present

## 2023-08-09 DIAGNOSIS — Z87891 Personal history of nicotine dependence: Secondary | ICD-10-CM | POA: Diagnosis not present

## 2023-11-22 DIAGNOSIS — M542 Cervicalgia: Secondary | ICD-10-CM | POA: Diagnosis not present

## 2023-11-22 DIAGNOSIS — G8929 Other chronic pain: Secondary | ICD-10-CM | POA: Diagnosis not present

## 2023-11-22 DIAGNOSIS — Z0000 Encounter for general adult medical examination without abnormal findings: Secondary | ICD-10-CM | POA: Diagnosis not present

## 2024-01-07 DIAGNOSIS — D485 Neoplasm of uncertain behavior of skin: Secondary | ICD-10-CM | POA: Diagnosis not present

## 2024-01-07 DIAGNOSIS — X32XXXA Exposure to sunlight, initial encounter: Secondary | ICD-10-CM | POA: Diagnosis not present

## 2024-01-07 DIAGNOSIS — D225 Melanocytic nevi of trunk: Secondary | ICD-10-CM | POA: Diagnosis not present

## 2024-01-07 DIAGNOSIS — L570 Actinic keratosis: Secondary | ICD-10-CM | POA: Diagnosis not present

## 2024-01-07 DIAGNOSIS — D2261 Melanocytic nevi of right upper limb, including shoulder: Secondary | ICD-10-CM | POA: Diagnosis not present

## 2024-01-07 DIAGNOSIS — Z1283 Encounter for screening for malignant neoplasm of skin: Secondary | ICD-10-CM | POA: Diagnosis not present

## 2024-01-15 DIAGNOSIS — M5412 Radiculopathy, cervical region: Secondary | ICD-10-CM | POA: Diagnosis not present

## 2024-01-16 ENCOUNTER — Encounter: Payer: Self-pay | Admitting: Neurology

## 2024-02-06 DIAGNOSIS — M25372 Other instability, left ankle: Secondary | ICD-10-CM | POA: Diagnosis not present

## 2024-02-06 DIAGNOSIS — Q661 Congenital talipes calcaneovarus, unspecified foot: Secondary | ICD-10-CM | POA: Diagnosis not present

## 2024-02-08 ENCOUNTER — Other Ambulatory Visit: Payer: Self-pay

## 2024-02-08 DIAGNOSIS — R202 Paresthesia of skin: Secondary | ICD-10-CM

## 2024-02-28 ENCOUNTER — Ambulatory Visit: Admitting: Neurology

## 2024-02-28 ENCOUNTER — Other Ambulatory Visit: Payer: Self-pay

## 2024-02-28 DIAGNOSIS — R202 Paresthesia of skin: Secondary | ICD-10-CM

## 2024-02-28 DIAGNOSIS — G5602 Carpal tunnel syndrome, left upper limb: Secondary | ICD-10-CM

## 2024-02-28 DIAGNOSIS — M5412 Radiculopathy, cervical region: Secondary | ICD-10-CM

## 2024-02-28 NOTE — Procedures (Signed)
 [facility] Neurology  [address] [place], [address]  [place], [place] [address] Tel: [phone] Fax: [phone] Test Date:  02/28/2024  Patient: Joseph Melton DOB: 20-Jul-1951 Physician: Tonita Blanch, DO  Sex: Male Height: 5' 10 Ref Phys: Alm Molt, MD  ID#: [id number]   Technician:    History: This is a 73 year old man referred for evaluation of bilateral arm numbness, worse on the left.    NCV & EMG Findings: Extensive electrodiagnostic testing of the left upper extremity and additional studies of the right shows:  Left median sensory response shows severely prolonged latency (9.0 ms) and reduced amplitude (6.5 V).  Right median, right mixed palmar, and bilateral ulnar sensory responses are within normal limits. Left median motor response shows severely prolonged latency (7.9 ms).  Right median and bilateral ulnar motor responses are within normal limits. Diffuse chronic motor axonal loss changes are seen affecting all the tested muscles of the left upper extremity which is worse at the C5-C6 myotomes.  There is no evidence of accompanying active denervation.   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  Left median neuropathy at or distal to the wrist, consistent with a clinical diagnosis of carpal tunnel syndrome.  Overall, these findings are severe in degree electrically.   ___________________________ Tonita Blanch, DO    Nerve Conduction Studies   Stim Site NR Peak (ms) Norm Peak (ms) O-P Amp (V) Norm O-P Amp  Left Median Anti Sensory (2nd Digit)  32 C  Wrist    *9.0 <3.8 *6.5 >10  Right Median Anti Sensory (2nd Digit)  32 C  Wrist    3.4 <3.8 14.9 >10  Left Ulnar Anti Sensory (5th Digit)  32 C  Wrist    3.2 <3.2 8.2 >5  Right Ulnar Anti Sensory (5th Digit)  32 C  Wrist    3.1 <3.2 11.9 >5     Stim Site NR Onset (ms) Norm Onset (ms) O-P Amp  (mV) Norm O-P Amp Site1 Site2 Delta-0 (ms) Dist (cm) Vel (m/s) Norm Vel (m/s)  Left Median Motor (Abd Poll Brev)  32 C  Wrist    *7.9 <4.0 6.4 >5 Elbow Wrist 6.4 32.0 50 >50  Elbow    14.3  5.7         Right Median Motor (Abd Poll Brev)  32 C  Wrist    3.4 <4.0 7.6 >5 Elbow Wrist 5.8 31.0 53 >50  Elbow    9.2  7.2         Left Ulnar Motor (Abd Dig Minimi)  32 C  Wrist    2.7 <3.1 9.5 >7 B Elbow Wrist 4.6 25.0 54 >50  B Elbow    7.3  8.4  A Elbow B Elbow 1.7 10.0 59 >50  A Elbow    9.0  8.0         Right Ulnar Motor (Abd Dig Minimi)  32 C  Wrist    2.7 <3.1 11.4 >7 B Elbow Wrist 4.0 24.0 60 >50  B Elbow    6.7  10.7  A Elbow B Elbow 1.7 10.0 59 >50  A Elbow    8.4  10.4            Stim Site NR Peak (ms) Norm Peak (ms) P-T Amp (V) Site1 Site2 Delta-P (ms) Norm Delta (ms)  Right Median/Ulnar Palm Comparison (Wrist - 8cm)  32 C  Median Palm  2.0 <2.2 38.9 Median Palm Ulnar Palm 0.1   Ulnar Palm    1.9 <2.2 7.1       Electromyography   Side Muscle Ins.Act Fibs Fasc Recrt Amp Dur Poly Activation Comment  Right 1stDorInt Nml Nml Nml Nml Nml Nml Nml Nml N/A  Right Abd Poll Brev Nml Nml Nml Nml Nml Nml Nml Nml N/A  Right PronatorTeres Nml Nml Nml Nml Nml Nml Nml Nml N/A  Right Biceps Nml Nml Nml Nml Nml Nml Nml Nml N/A  Right Triceps Nml Nml Nml Nml Nml Nml Nml Nml N/A  Right Deltoid Nml Nml Nml Nml Nml Nml Nml Nml N/A  Left 1stDorInt Nml Nml Nml *1- *1+ *1+ *1+ Nml N/A  Left Abd Poll Brev Nml Nml Nml *2- *1+ *1+ *1+ Nml N/A  Left PronatorTeres Nml Nml Nml *3- *1+ *1+ *1+ Nml N/A  Left Biceps Nml Nml Nml *3- *1+ *1+ *1+ Nml N/A  Left Triceps Nml Nml Nml *3- *1+ *1+ *1+ Nml N/A  Left Deltoid Nml Nml Nml *3- *1+ *1+ *1+ Nml N/A  Left Ext Indicis Nml Nml Nml *1- *1+ *1+ *1+ Nml N/A      Waveforms:

## 2024-03-11 DIAGNOSIS — Z0000 Encounter for general adult medical examination without abnormal findings: Secondary | ICD-10-CM | POA: Diagnosis not present

## 2024-03-25 DIAGNOSIS — G8929 Other chronic pain: Secondary | ICD-10-CM | POA: Diagnosis not present

## 2024-03-25 DIAGNOSIS — I720 Aneurysm of carotid artery: Secondary | ICD-10-CM | POA: Diagnosis not present

## 2024-03-25 DIAGNOSIS — K5909 Other constipation: Secondary | ICD-10-CM | POA: Diagnosis not present

## 2024-03-25 DIAGNOSIS — I209 Angina pectoris, unspecified: Secondary | ICD-10-CM | POA: Diagnosis not present

## 2024-03-25 DIAGNOSIS — Z0001 Encounter for general adult medical examination with abnormal findings: Secondary | ICD-10-CM | POA: Diagnosis not present

## 2024-03-25 DIAGNOSIS — M542 Cervicalgia: Secondary | ICD-10-CM | POA: Diagnosis not present

## 2024-03-25 DIAGNOSIS — I712 Thoracic aortic aneurysm, without rupture, unspecified: Secondary | ICD-10-CM | POA: Diagnosis not present

## 2024-03-25 DIAGNOSIS — G4719 Other hypersomnia: Secondary | ICD-10-CM | POA: Diagnosis not present

## 2024-03-25 DIAGNOSIS — R102 Pelvic and perineal pain: Secondary | ICD-10-CM | POA: Diagnosis not present

## 2024-03-26 DIAGNOSIS — M4803 Spinal stenosis, cervicothoracic region: Secondary | ICD-10-CM | POA: Diagnosis not present

## 2024-03-26 DIAGNOSIS — M4722 Other spondylosis with radiculopathy, cervical region: Secondary | ICD-10-CM | POA: Diagnosis not present

## 2024-03-26 DIAGNOSIS — M5010 Cervical disc disorder with radiculopathy, unspecified cervical region: Secondary | ICD-10-CM | POA: Diagnosis not present

## 2024-03-26 DIAGNOSIS — M5412 Radiculopathy, cervical region: Secondary | ICD-10-CM | POA: Diagnosis not present

## 2024-03-26 DIAGNOSIS — M4802 Spinal stenosis, cervical region: Secondary | ICD-10-CM | POA: Diagnosis not present

## 2024-04-17 DIAGNOSIS — M5412 Radiculopathy, cervical region: Secondary | ICD-10-CM | POA: Diagnosis not present

## 2024-04-17 DIAGNOSIS — Z6831 Body mass index (BMI) 31.0-31.9, adult: Secondary | ICD-10-CM | POA: Diagnosis not present

## 2024-04-17 DIAGNOSIS — R200 Anesthesia of skin: Secondary | ICD-10-CM | POA: Diagnosis not present

## 2024-04-21 ENCOUNTER — Other Ambulatory Visit: Payer: Self-pay | Admitting: Neurological Surgery

## 2024-04-24 ENCOUNTER — Encounter (HOSPITAL_COMMUNITY): Payer: Self-pay

## 2024-04-24 NOTE — Pre-Procedure Instructions (Signed)
 Surgical Instructions   Your procedure is scheduled on May 08, 2024. Report to [facility] Main Entrance A at 5:30 A.M., then check in with the Admitting office. Any questions or running late day of surgery: call [phone]  Questions prior to your surgery date: call [phone], Monday-Friday, 8am-4pm. If you experience any cold or flu symptoms such as cough, fever, chills, shortness of breath, etc. between now and your scheduled surgery, please notify us  at the above number.     Remember:  Do not eat or drink after midnight the night before your surgery    Take these medicines the morning of surgery with A SIP OF WATER : traMADol (ULTRAM) - may take if needed   One week prior to surgery, STOP taking any Aspirin  (unless otherwise instructed by your surgeon) Aleve, Naproxen, Ibuprofen , Motrin , Advil , Goody's, BC's, all herbal medications, fish oil, and non-prescription vitamins.                     Do NOT Smoke (Tobacco/Vaping) for 24 hours prior to your procedure.  If you use a CPAP at night, you may bring your mask/headgear for your overnight stay.   You will be asked to remove any contacts, glasses, piercing's, hearing aid's, dentures/partials prior to surgery. Please bring cases for these items if needed.    Patients discharged the day of surgery will not be allowed to drive home, and someone needs to stay with them for 24 hours.  SURGICAL WAITING ROOM VISITATION Patients may have no more than 2 support people in the waiting area - these visitors may rotate.   Pre-op nurse will coordinate an appropriate time for 1 ADULT support person, who may not rotate, to accompany patient in pre-op.  Children under the age of 33 must have an adult with them who is not the patient and must remain in the main waiting area with an adult.  If the patient needs to stay at the hospital during part of their recovery, the visitor guidelines for inpatient rooms apply.  Please refer to the  [facility] website for the visitor guidelines for any additional information.   If you received a COVID test during your pre-op visit  it is requested that you wear a mask when out in public, stay away from anyone that may not be feeling well and notify your surgeon if you develop symptoms. If you have been in contact with anyone that has tested positive in the last 10 days please notify you surgeon.      Pre-operative 5 CHG Bathing Instructions   You can play a key role in reducing the risk of infection after surgery. Your skin needs to be as free of germs as possible. You can reduce the number of germs on your skin by washing with CHG (chlorhexidine  gluconate) soap before surgery. CHG is an antiseptic soap that kills germs and continues to kill germs even after washing.   DO NOT use if you have an allergy to chlorhexidine /CHG or antibacterial soaps. If your skin becomes reddened or irritated, stop using the CHG and notify one of our RNs at [phone].   Please shower with the CHG soap starting 4 days before surgery using the following schedule:     Please keep in mind the following:  DO NOT shave, including legs and underarms, starting the day of your first shower.   You may shave your face at any point before/day of surgery.  Place clean sheets on your bed the day  you start using CHG soap. Use a clean washcloth (not used since being washed) for each shower. DO NOT sleep with pets once you start using the CHG.   CHG Shower Instructions:  Wash your face and private area with normal soap. If you choose to wash your hair, wash first with your normal shampoo.  After you use shampoo/soap, rinse your hair and body thoroughly to remove shampoo/soap residue.  Turn the water  OFF and apply about 3 tablespoons (45 ml) of CHG soap to a CLEAN washcloth.  Apply CHG soap ONLY FROM YOUR NECK DOWN TO YOUR TOES (washing for 3-5 minutes)  DO NOT use CHG soap on face, private areas, open wounds, or  sores.  Pay special attention to the area where your surgery is being performed.  If you are having back surgery, having someone wash your back for you may be helpful. Wait 2 minutes after CHG soap is applied, then you may rinse off the CHG soap.  Pat dry with a clean towel  Put on clean clothes/pajamas   If you choose to wear lotion, please use ONLY the CHG-compatible lotions that are listed below.  Additional instructions for the day of surgery: DO NOT APPLY any lotions, deodorants, cologne, or perfumes.   Do not bring valuables to the hospital. [facility] is not responsible for any belongings/valuables. Do not wear nail polish, gel polish, artificial nails, or any other type of covering on natural nails (fingers and toes) Do not wear jewelry or makeup Put on clean/comfortable clothes.  Please brush your teeth.  Ask your nurse before applying any prescription medications to the skin.     CHG Compatible Lotions   Aveeno Moisturizing lotion  Cetaphil Moisturizing Cream  Cetaphil Moisturizing Lotion  Clairol Herbal Essence Moisturizing Lotion, Dry Skin  Clairol Herbal Essence Moisturizing Lotion, Extra Dry Skin  Clairol Herbal Essence Moisturizing Lotion, Normal Skin  Curel Age Defying Therapeutic Moisturizing Lotion with Alpha Hydroxy  Curel Extreme Care Body Lotion  Curel Soothing Hands Moisturizing Hand Lotion  Curel Therapeutic Moisturizing Cream, Fragrance-Free  Curel Therapeutic Moisturizing Lotion, Fragrance-Free  Curel Therapeutic Moisturizing Lotion, Original Formula  Eucerin Daily Replenishing Lotion  Eucerin Dry Skin Therapy Plus Alpha Hydroxy Crme  Eucerin Dry Skin Therapy Plus Alpha Hydroxy Lotion  Eucerin Original Crme  Eucerin Original Lotion  Eucerin Plus Crme Eucerin Plus Lotion  Eucerin TriLipid Replenishing Lotion  Keri Anti-Bacterial Hand Lotion  Keri Deep Conditioning Original Lotion Dry Skin Formula Softly Scented  Keri Deep Conditioning Original  Lotion, Fragrance Free Sensitive Skin Formula  Keri Lotion Fast Absorbing Fragrance Free Sensitive Skin Formula  Keri Lotion Fast Absorbing Softly Scented Dry Skin Formula  Keri Original Lotion  Keri Skin Renewal Lotion Keri Silky Smooth Lotion  Keri Silky Smooth Sensitive Skin Lotion  Nivea Body Creamy Conditioning Oil  Nivea Body Extra Enriched Lotion  Nivea Body Original Lotion  Nivea Body Sheer Moisturizing Lotion Nivea Crme  Nivea Skin Firming Lotion  NutraDerm 30 Skin Lotion  NutraDerm Skin Lotion  NutraDerm Therapeutic Skin Cream  NutraDerm Therapeutic Skin Lotion  ProShield Protective Hand Cream  Provon moisturizing lotion  Please read over the following fact sheets that you were given.

## 2024-04-25 ENCOUNTER — Encounter (HOSPITAL_COMMUNITY)
Admission: RE | Admit: 2024-04-25 | Discharge: 2024-04-25 | Disposition: A | Discharge status: Home / Self Care | Source: Ambulatory Visit | Attending: Neurological Surgery | Admitting: Neurological Surgery

## 2024-04-25 ENCOUNTER — Other Ambulatory Visit: Payer: Self-pay

## 2024-04-25 ENCOUNTER — Encounter (HOSPITAL_COMMUNITY): Payer: Self-pay

## 2024-04-25 VITALS — BP 149/93 | HR 72 | Temp 98.2°F | Resp 18 | Ht 70.0 in | Wt 220.4 lb

## 2024-04-25 DIAGNOSIS — I2510 Atherosclerotic heart disease of native coronary artery without angina pectoris: Secondary | ICD-10-CM | POA: Diagnosis not present

## 2024-04-25 DIAGNOSIS — Z87891 Personal history of nicotine dependence: Secondary | ICD-10-CM | POA: Diagnosis not present

## 2024-04-25 DIAGNOSIS — Z01818 Encounter for other preprocedural examination: Secondary | ICD-10-CM | POA: Insufficient documentation

## 2024-04-25 DIAGNOSIS — I77810 Thoracic aortic ectasia: Secondary | ICD-10-CM | POA: Diagnosis not present

## 2024-04-25 DIAGNOSIS — Z01812 Encounter for preprocedural laboratory examination: Secondary | ICD-10-CM | POA: Diagnosis present

## 2024-04-25 DIAGNOSIS — Z01810 Encounter for preprocedural cardiovascular examination: Secondary | ICD-10-CM | POA: Diagnosis present

## 2024-04-25 HISTORY — DX: Peripheral vascular disease, unspecified: I73.9

## 2024-04-25 LAB — CBC
HCT: 44.8 % (ref 39.0–52.0)
Hemoglobin: 14.8 g/dL (ref 13.0–17.0)
MCH: 30.5 pg (ref 26.0–34.0)
MCHC: 33 g/dL (ref 30.0–36.0)
MCV: 92.2 fL (ref 80.0–100.0)
Platelets: 228 K/uL (ref 150–400)
RBC: 4.86 MIL/uL (ref 4.22–5.81)
RDW: 13.6 % (ref 11.5–15.5)
WBC: 8.1 K/uL (ref 4.0–10.5)
nRBC: 0 % (ref 0.0–0.2)

## 2024-04-25 LAB — BASIC METABOLIC PANEL WITH GFR
Anion gap: 9 (ref 5–15)
BUN: 23 mg/dL (ref 8–23)
CO2: 23 mmol/L (ref 22–32)
Calcium: 8.7 mg/dL — ABNORMAL LOW (ref 8.9–10.3)
Chloride: 105 mmol/L (ref 98–111)
Creatinine, Ser: 1.15 mg/dL (ref 0.61–1.24)
GFR, Estimated: >60 mL/min (ref >60)
Glucose, Bld: 101 mg/dL — ABNORMAL HIGH (ref 70–99)
Potassium: 3.8 mmol/L (ref 3.5–5.1)
Sodium: 137 mmol/L (ref 135–145)

## 2024-04-25 LAB — TYPE AND SCREEN
ABO/RH(D): A POS
Antibody Screen: NEGATIVE

## 2024-04-25 LAB — SURGICAL PCR SCREEN
MRSA, PCR: NEGATIVE
Staphylococcus aureus: NEGATIVE

## 2024-04-25 NOTE — Progress Notes (Signed)
 PCP - Carliss Batty, NP Cardiologist - Pt saw Dr. Ozell Fell in February 2023 for CP. CT coronary and echo unremarkable. PRN follow-up  PPM/ICD - Denies Device Orders - n/a Rep Notified - n/a  Chest x-ray - n/a EKG - 04/25/2024 Stress Test - Denies ECHO - 09/30/2021 Cardiac Cath - Denies CT Coronary - 10/04/2021  Sleep Study - Denies - STOPBANG score 5. Results sent to PCP  No DM  Last dose of GLP1 agonist- n/a GLP1 instructions: n/a  Blood Thinner Instructions: n/a Aspirin  Instructions: n/a  NPO after midnight  COVID TEST- n/a   Anesthesia review: No   Patient denies shortness of breath, fever, cough and chest pain at PAT appointment. Pt denies any respiratory illness/infection in the last two months.    All instructions explained to the patient, with a verbal understanding of the material. Patient agrees to go over the instructions while at home for a better understanding. Patient also instructed to self quarantine after being tested for COVID-19. The opportunity to ask questions was provided.

## 2024-04-28 NOTE — Progress Notes (Signed)
 Anesthesia Chart Review:  73 year old male with pertinent history including former smoker (quit 2014), dilated ascending aorta, mild nonobstructive CAD.  Patient was evaluated by cardiologist Dr. Wonda in February 2023 for atypical chest pain.  Echo and coronary CTA were ordered.  Echo 09/30/2021 showed LVEF 60 to 65%, normal RV, no significant valvular abnormalities, dilated aortic root.  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  Review of previous anesthesia records shows that GlideScope was used for intubation 12/30/2014 for laparoscopic right inguinal hernia repair at [facility].    Patient had left shoulder arthroscopy at [facility] on 11/06/2017.  Anesthesia intubation records states MAC 4 was used with grade 3 view, 1 attempt, mask ventilation without difficulty.  Preop labs reviewed, unremarkable.  EKG 04/25/2024: NSR.  Rate 69.  Coronary CTA 10/04/2021: IMPRESSION: 1.  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  Suggest annual followup.   2. Coronary artery calcium score 76 Agatston units. This places the patient in the 37th percentile for age and gender, suggesting low risk for future cardiac events.   3.  Mild, nonobstructive CAD.  TTE 09/30/2021: 1. Moderately dilated ascending aorta (4.6 cm); suggest CTA to further  assess.   2. Left ventricular ejection fraction, by estimation, is 60 to 65%. The  left ventricle has normal function. The left ventricle has no regional  wall motion abnormalities. Left ventricular diastolic parameters were  normal. The average left ventricular  global longitudinal strain is -20.3 %. The global longitudinal strain is  normal.   3. Right ventricular systolic function is normal. The right ventricular  size is normal.   4. The mitral valve is normal in structure. Trivial mitral valve  regurgitation. No evidence of  mitral stenosis.   5. The aortic valve is tricuspid. Aortic valve regurgitation is not  visualized. Aortic valve sclerosis is present, with no evidence of aortic  valve stenosis.   6. Aortic dilatation noted. There is moderate dilatation of the ascending  aorta, measuring 46 mm.   7. The inferior vena cava is normal in size with greater than 50%  respiratory variability, suggesting right atrial pressure of 3 mmHg.      Lynwood Geofm RIGGERS [facility]/Anesthesiology Phone [phone] 04/28/2024 4:06 PM

## 2024-04-28 NOTE — Anesthesia Preprocedure Evaluation (Addendum)
 Anesthesia Evaluation  Patient identified by MRN, date of birth, ID band Patient awake    Reviewed: Allergy & Precautions, H&P , NPO status , Patient's Chart, lab work & pertinent test results  Airway Mallampati: II   Neck ROM: limited    Dental   Pulmonary former smoker   breath sounds clear to auscultation       Cardiovascular + Peripheral Vascular Disease   Rhythm:regular Rate:Normal     Neuro/Psych    GI/Hepatic   Endo/Other    Renal/GU      Musculoskeletal  (+) Arthritis ,    Abdominal   Peds  Hematology   Anesthesia Other Findings   Reproductive/Obstetrics                              Anesthesia Physical Anesthesia Plan  ASA: 2  Anesthesia Plan: General   Post-op Pain Management:    Induction: Intravenous  PONV Risk Score and Plan: 2 and Ondansetron , Dexamethasone  and Treatment may vary due to age or medical condition  Airway Management Planned: Oral ETT and Video Laryngoscope Planned  Additional Equipment:   Intra-op Plan:   Post-operative Plan: Extubation in OR  Informed Consent: I have reviewed the patients History and Physical, chart, labs and discussed the procedure including the risks, benefits and alternatives for the proposed anesthesia with the patient or authorized representative who has indicated his/her understanding and acceptance.     Dental advisory given  Plan Discussed with: CRNA, Anesthesiologist and Surgeon  Anesthesia Plan Comments: (PAT note by Lynwood Hope, PA-C:  73 year old male with pertinent history including former smoker (quit 2014), dilated ascending aorta, mild nonobstructive CAD.  Patient was evaluated by cardiologist Dr. Wonda in February 2023 for atypical chest pain.  Echo and coronary CTA were ordered.  Echo 09/30/2021 showed LVEF 60 to 65%, normal RV, no significant valvular abnormalities, dilated aortic root.  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  Review of previous anesthesia records shows that GlideScope was used for intubation 12/30/2014 for laparoscopic right inguinal hernia repair at [facility].    Patient had left shoulder arthroscopy at [facility] on 11/06/2017.  Anesthesia intubation records states MAC 4 was used with grade 3 view, 1 attempt, mask ventilation without difficulty.  Preop labs reviewed, unremarkable.  EKG 04/25/2024: NSR.  Rate 69.  Coronary CTA 10/04/2021: IMPRESSION: 1.  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  Suggest annual followup.  2. Coronary artery calcium score 76 Agatston units. This places the patient in the 37th percentile for age and gender, suggesting low risk for future cardiac events.  3.  Mild, nonobstructive CAD.  TTE 09/30/2021: 1. Moderately dilated ascending aorta (4.6 cm); suggest CTA to further  assess.  2. Left ventricular ejection fraction, by estimation, is 60 to 65%. The  left ventricle has normal function. The left ventricle has no regional  wall motion abnormalities. Left ventricular diastolic parameters were  normal. The average left ventricular  global longitudinal strain is -20.3 %. The global longitudinal strain is  normal.  3. Right ventricular systolic function is normal. The right ventricular  size is normal.  4. The mitral valve is normal in structure. Trivial mitral valve  regurgitation. No evidence of mitral stenosis.  5. The aortic valve is tricuspid. Aortic valve regurgitation is not  visualized. Aortic valve  sclerosis is present, with no evidence of aortic  valve stenosis.  6. Aortic dilatation noted. There is moderate dilatation of the ascending  aorta, measuring 46 mm.  7. The inferior vena cava is normal in size with greater than 50%  respiratory variability, suggesting right atrial pressure of 3  mmHg.    )         Anesthesia Quick Evaluation

## 2024-05-01 DIAGNOSIS — M5412 Radiculopathy, cervical region: Secondary | ICD-10-CM | POA: Diagnosis not present

## 2024-05-08 ENCOUNTER — Other Ambulatory Visit: Payer: Self-pay

## 2024-05-08 ENCOUNTER — Inpatient Hospital Stay (HOSPITAL_COMMUNITY)

## 2024-05-08 ENCOUNTER — Encounter (HOSPITAL_COMMUNITY)
Admission: RE | Disposition: A | Discharge status: Home / Self Care | Payer: Self-pay | Source: Home / Self Care | Attending: Neurological Surgery

## 2024-05-08 ENCOUNTER — Inpatient Hospital Stay (HOSPITAL_COMMUNITY): Payer: Self-pay | Admitting: Physician Assistant

## 2024-05-08 ENCOUNTER — Encounter (HOSPITAL_COMMUNITY): Payer: Self-pay | Admitting: Neurological Surgery

## 2024-05-08 ENCOUNTER — Inpatient Hospital Stay (HOSPITAL_COMMUNITY)
Admission: RE | Admit: 2024-05-08 | Discharge: 2024-05-09 | DRG: 473 | Disposition: A | Discharge status: Home / Self Care | Attending: Neurological Surgery | Admitting: Neurological Surgery

## 2024-05-08 DIAGNOSIS — G629 Polyneuropathy, unspecified: Secondary | ICD-10-CM | POA: Diagnosis not present

## 2024-05-08 DIAGNOSIS — M4802 Spinal stenosis, cervical region: Secondary | ICD-10-CM | POA: Diagnosis present

## 2024-05-08 DIAGNOSIS — G5602 Carpal tunnel syndrome, left upper limb: Secondary | ICD-10-CM | POA: Diagnosis present

## 2024-05-08 DIAGNOSIS — Z87891 Personal history of nicotine dependence: Secondary | ICD-10-CM

## 2024-05-08 DIAGNOSIS — M50121 Cervical disc disorder at C4-C5 level with radiculopathy: Secondary | ICD-10-CM | POA: Diagnosis not present

## 2024-05-08 DIAGNOSIS — M5412 Radiculopathy, cervical region: Secondary | ICD-10-CM | POA: Diagnosis present

## 2024-05-08 DIAGNOSIS — Z981 Arthrodesis status: Principal | ICD-10-CM

## 2024-05-08 DIAGNOSIS — M4722 Other spondylosis with radiculopathy, cervical region: Principal | ICD-10-CM | POA: Diagnosis present

## 2024-05-08 DIAGNOSIS — M4712 Other spondylosis with myelopathy, cervical region: Secondary | ICD-10-CM | POA: Diagnosis not present

## 2024-05-08 HISTORY — PX: CARPAL TUNNEL RELEASE: SHX101

## 2024-05-08 HISTORY — PX: ANTERIOR CERVICAL DECOMP/DISCECTOMY FUSION: SHX1161

## 2024-05-08 LAB — ABO/RH: ABO/RH(D): A POS

## 2024-05-08 SURGERY — ANTERIOR CERVICAL DECOMPRESSION/DISCECTOMY FUSION 3 LEVELS
Anesthesia: General | Site: Spine Cervical

## 2024-05-08 MED ORDER — CHLORHEXIDINE GLUCONATE 0.12 % MT SOLN
15.0000 mL | Freq: Once | OROMUCOSAL | Status: AC
  Administered 2024-05-08: 15 mL via OROMUCOSAL
  Filled 2024-05-08: qty 15

## 2024-05-08 MED ORDER — DEXAMETHASONE SODIUM PHOSPHATE 10 MG/ML IJ SOLN
INTRAMUSCULAR | Status: DC | PRN
  Administered 2024-05-08: 10 mg via INTRAVENOUS

## 2024-05-08 MED ORDER — PHENYLEPHRINE HCL-NACL 20-0.9 MG/250ML-% IV SOLN
INTRAVENOUS | Status: DC | PRN
  Administered 2024-05-08: 20 ug/min via INTRAVENOUS

## 2024-05-08 MED ORDER — GABAPENTIN 300 MG PO CAPS
300.0000 mg | ORAL_CAPSULE | ORAL | Status: AC
  Administered 2024-05-08: 300 mg via ORAL
  Filled 2024-05-08: qty 1

## 2024-05-08 MED ORDER — ONDANSETRON HCL 4 MG/2ML IJ SOLN
4.0000 mg | Freq: Four times a day (QID) | INTRAMUSCULAR | Status: AC | PRN
  Administered 2024-05-08: 4 mg via INTRAVENOUS

## 2024-05-08 MED ORDER — BUPIVACAINE HCL (PF) 0.25 % IJ SOLN
INTRAMUSCULAR | Status: AC
  Filled 2024-05-08: qty 30

## 2024-05-08 MED ORDER — LIDOCAINE 2% (20 MG/ML) 5 ML SYRINGE
INTRAMUSCULAR | Status: AC
  Filled 2024-05-08: qty 5

## 2024-05-08 MED ORDER — CEFAZOLIN SODIUM-DEXTROSE 2-4 GM/100ML-% IV SOLN
2.0000 g | INTRAVENOUS | Status: AC
  Administered 2024-05-08: 2 g via INTRAVENOUS
  Filled 2024-05-08: qty 100

## 2024-05-08 MED ORDER — DEXAMETHASONE SODIUM PHOSPHATE 4 MG/ML IJ SOLN
4.0000 mg | Freq: Four times a day (QID) | INTRAMUSCULAR | Status: DC
  Administered 2024-05-08 (×2): 4 mg via INTRAVENOUS
  Filled 2024-05-08 (×2): qty 1

## 2024-05-08 MED ORDER — THROMBIN 5000 UNITS EX SOLR
OROMUCOSAL | Status: DC | PRN
  Administered 2024-05-08: 5 mL via TOPICAL

## 2024-05-08 MED ORDER — ONDANSETRON HCL 4 MG/2ML IJ SOLN: 4.0000 mg | Freq: Four times a day (QID) | INTRAMUSCULAR | Status: DC | PRN

## 2024-05-08 MED ORDER — METHOCARBAMOL 500 MG PO TABS
500.0000 mg | ORAL_TABLET | Freq: Four times a day (QID) | ORAL | Status: DC | PRN
  Administered 2024-05-08 (×2): 500 mg via ORAL
  Filled 2024-05-08 (×2): qty 1

## 2024-05-08 MED ORDER — POTASSIUM CHLORIDE IN NACL 20-0.9 MEQ/L-% IV SOLN
INTRAVENOUS | Status: DC
  Filled 2024-05-08: qty 1000

## 2024-05-08 MED ORDER — FENTANYL CITRATE (PF) 100 MCG/2ML IJ SOLN
INTRAMUSCULAR | Status: AC
  Filled 2024-05-08: qty 2

## 2024-05-08 MED ORDER — ROCURONIUM BROMIDE 10 MG/ML (PF) SYRINGE
PREFILLED_SYRINGE | INTRAVENOUS | Status: DC | PRN
  Administered 2024-05-08: 60 mg via INTRAVENOUS
  Administered 2024-05-08 (×3): 20 mg via INTRAVENOUS

## 2024-05-08 MED ORDER — METHOCARBAMOL 1000 MG/10ML IJ SOLN: 500.0000 mg | Freq: Four times a day (QID) | INTRAMUSCULAR | Status: DC | PRN

## 2024-05-08 MED ORDER — DEXAMETHASONE 4 MG PO TABS
4.0000 mg | ORAL_TABLET | Freq: Four times a day (QID) | ORAL | Status: DC
  Administered 2024-05-08 – 2024-05-09 (×2): 4 mg via ORAL
  Filled 2024-05-08 (×2): qty 1

## 2024-05-08 MED ORDER — PHENYLEPHRINE 80 MCG/ML (10ML) SYRINGE FOR IV PUSH (FOR BLOOD PRESSURE SUPPORT)
PREFILLED_SYRINGE | INTRAVENOUS | Status: AC
  Filled 2024-05-08: qty 10

## 2024-05-08 MED ORDER — ONDANSETRON HCL 4 MG/2ML IJ SOLN
INTRAMUSCULAR | Status: AC
  Filled 2024-05-08: qty 2

## 2024-05-08 MED ORDER — SODIUM CHLORIDE 0.9 % IV SOLN
250.0000 mL | INTRAVENOUS | Status: DC
  Administered 2024-05-08: 250 mL via INTRAVENOUS

## 2024-05-08 MED ORDER — 0.9 % SODIUM CHLORIDE (POUR BTL) OPTIME
TOPICAL | Status: DC | PRN
  Administered 2024-05-08: 1000 mL

## 2024-05-08 MED ORDER — EPHEDRINE 5 MG/ML INJ
INTRAVENOUS | Status: AC
Start: 2024-05-08 — End: 2024-05-08
  Filled 2024-05-08: qty 5

## 2024-05-08 MED ORDER — ONDANSETRON HCL 4 MG PO TABS: 4.0000 mg | ORAL_TABLET | Freq: Four times a day (QID) | ORAL | Status: DC | PRN

## 2024-05-08 MED ORDER — OXYCODONE HCL 5 MG PO TABS
5.0000 mg | ORAL_TABLET | Freq: Once | ORAL | Status: AC | PRN
  Administered 2024-05-08: 5 mg via ORAL

## 2024-05-08 MED ORDER — SUGAMMADEX SODIUM 200 MG/2ML IV SOLN
INTRAVENOUS | Status: DC | PRN
  Administered 2024-05-08: 200 mg via INTRAVENOUS

## 2024-05-08 MED ORDER — ACETAMINOPHEN 500 MG PO TABS
1000.0000 mg | ORAL_TABLET | Freq: Four times a day (QID) | ORAL | Status: AC
  Administered 2024-05-08 – 2024-05-09 (×4): 1000 mg via ORAL
  Filled 2024-05-08 (×4): qty 2

## 2024-05-08 MED ORDER — LIDOCAINE 2% (20 MG/ML) 5 ML SYRINGE
INTRAMUSCULAR | Status: DC | PRN
  Administered 2024-05-08: 100 mg via INTRAVENOUS

## 2024-05-08 MED ORDER — HYDROCODONE-ACETAMINOPHEN 10-325 MG PO TABS: 1.0000 | ORAL_TABLET | ORAL | Status: DC | PRN

## 2024-05-08 MED ORDER — ORAL CARE MOUTH RINSE: 15.0000 mL | Freq: Once | OROMUCOSAL | Status: AC

## 2024-05-08 MED ORDER — SODIUM CHLORIDE 0.9% FLUSH: 3.0000 mL | INTRAVENOUS | Status: DC | PRN

## 2024-05-08 MED ORDER — CHLORHEXIDINE GLUCONATE CLOTH 2 % EX PADS: 6.0000 | MEDICATED_PAD | Freq: Once | CUTANEOUS | Status: DC

## 2024-05-08 MED ORDER — MENTHOL 3 MG MT LOZG: 1.0000 | LOZENGE | OROMUCOSAL | Status: DC | PRN

## 2024-05-08 MED ORDER — ALBUMIN HUMAN 5 % IV SOLN: INTRAVENOUS | Status: DC | PRN

## 2024-05-08 MED ORDER — FENTANYL CITRATE (PF) 250 MCG/5ML IJ SOLN
INTRAMUSCULAR | Status: DC | PRN
  Administered 2024-05-08 (×2): 100 ug via INTRAVENOUS
  Administered 2024-05-08: 50 ug via INTRAVENOUS

## 2024-05-08 MED ORDER — BUPIVACAINE HCL (PF) 0.25 % IJ SOLN
INTRAMUSCULAR | Status: DC | PRN
  Administered 2024-05-08: 10 mL
  Administered 2024-05-08: 8 mL

## 2024-05-08 MED ORDER — PROPOFOL 10 MG/ML IV BOLUS
INTRAVENOUS | Status: AC
  Filled 2024-05-08: qty 20

## 2024-05-08 MED ORDER — LACTATED RINGERS IV SOLN
INTRAVENOUS | Status: DC
Start: 2024-05-08 — End: 2024-05-08

## 2024-05-08 MED ORDER — SENNA 8.6 MG PO TABS
1.0000 | ORAL_TABLET | Freq: Two times a day (BID) | ORAL | Status: DC
  Administered 2024-05-08 (×2): 8.6 mg via ORAL
  Filled 2024-05-08 (×2): qty 1

## 2024-05-08 MED ORDER — CEFAZOLIN SODIUM-DEXTROSE 2-4 GM/100ML-% IV SOLN
2.0000 g | Freq: Three times a day (TID) | INTRAVENOUS | Status: AC
  Administered 2024-05-08 (×2): 2 g via INTRAVENOUS
  Filled 2024-05-08 (×2): qty 100

## 2024-05-08 MED ORDER — SODIUM CHLORIDE 0.9% FLUSH
3.0000 mL | Freq: Two times a day (BID) | INTRAVENOUS | Status: DC
  Administered 2024-05-08 (×2): 3 mL via INTRAVENOUS

## 2024-05-08 MED ORDER — FENTANYL CITRATE (PF) 100 MCG/2ML IJ SOLN
25.0000 ug | INTRAMUSCULAR | Status: DC | PRN
  Administered 2024-05-08 (×3): 50 ug via INTRAVENOUS

## 2024-05-08 MED ORDER — OXYCODONE HCL 5 MG PO TABS
ORAL_TABLET | ORAL | Status: AC
  Filled 2024-05-08: qty 1

## 2024-05-08 MED ORDER — ACETAMINOPHEN 500 MG PO TABS
1000.0000 mg | ORAL_TABLET | ORAL | Status: AC
  Administered 2024-05-08: 1000 mg via ORAL
  Filled 2024-05-08: qty 2

## 2024-05-08 MED ORDER — PROPOFOL 10 MG/ML IV BOLUS
INTRAVENOUS | Status: DC | PRN
  Administered 2024-05-08: 150 mg via INTRAVENOUS

## 2024-05-08 MED ORDER — FENTANYL CITRATE (PF) 250 MCG/5ML IJ SOLN
INTRAMUSCULAR | Status: AC
  Filled 2024-05-08: qty 5

## 2024-05-08 MED ORDER — OXYCODONE HCL 5 MG/5ML PO SOLN: 5.0000 mg | Freq: Once | ORAL | Status: AC | PRN

## 2024-05-08 MED ORDER — DEXAMETHASONE SODIUM PHOSPHATE 10 MG/ML IJ SOLN
INTRAMUSCULAR | Status: AC
  Filled 2024-05-08: qty 1

## 2024-05-08 MED ORDER — EPHEDRINE SULFATE-NACL 50-0.9 MG/10ML-% IV SOSY
PREFILLED_SYRINGE | INTRAVENOUS | Status: DC | PRN
  Administered 2024-05-08: 10 mg via INTRAVENOUS
  Administered 2024-05-08: 5 mg via INTRAVENOUS

## 2024-05-08 MED ORDER — ROCURONIUM BROMIDE 10 MG/ML (PF) SYRINGE
PREFILLED_SYRINGE | INTRAVENOUS | Status: AC
  Filled 2024-05-08: qty 10

## 2024-05-08 MED ORDER — THROMBIN 5000 UNITS EX KIT
PACK | CUTANEOUS | Status: AC
  Filled 2024-05-08: qty 1

## 2024-05-08 MED ORDER — PHENOL 1.4 % MT LIQD: 1.0000 | OROMUCOSAL | Status: DC | PRN

## 2024-05-08 SURGICAL SUPPLY — 65 items
BAG COUNTER SPONGE SURGICOUNT (BAG) ×4 IMPLANT
BAND RUBBER #18 3X1/16 STRL (MISCELLANEOUS) ×4 IMPLANT
BASKET BONE COLLECTION (BASKET) ×2 IMPLANT
BENZOIN TINCTURE PRP APPL 2/3 (GAUZE/BANDAGES/DRESSINGS) ×2 IMPLANT
BIT DRILL SPS 12 (BIT) IMPLANT
BLADE SURG 15 STRL LF DISP TIS (BLADE) ×2 IMPLANT
BNDG ELASTIC 3INX 5YD STR LF (GAUZE/BANDAGES/DRESSINGS) IMPLANT
BNDG ELASTIC 4X5.8 VLCR STR LF (GAUZE/BANDAGES/DRESSINGS) ×2 IMPLANT
BNDG GAUZE DERMACEA FLUFF 4 (GAUZE/BANDAGES/DRESSINGS) ×2 IMPLANT
BUR CARBIDE MATCH 3.0 (BURR) ×2 IMPLANT
CABLE BIPOLOR RESECTION CORD (MISCELLANEOUS) ×2 IMPLANT
CANISTER SUCTION 3000ML PPV (SUCTIONS) ×4 IMPLANT
DERMABOND ADVANCED .7 DNX12 (GAUZE/BANDAGES/DRESSINGS) IMPLANT
DRAPE C-ARM 42X72 X-RAY (DRAPES) ×4 IMPLANT
DRAPE EXTREMITY T 121X128X90 (DISPOSABLE) ×2 IMPLANT
DRAPE HALF SHEET 40X57 (DRAPES) ×2 IMPLANT
DRAPE LAPAROTOMY 100X72 PEDS (DRAPES) ×2 IMPLANT
DRAPE MICROSCOPE SLANT 54X150 (MISCELLANEOUS) IMPLANT
DRSG ADAPTIC 3X8 NADH LF (GAUZE/BANDAGES/DRESSINGS) IMPLANT
DRSG OPSITE POSTOP 4X6 (GAUZE/BANDAGES/DRESSINGS) IMPLANT
DURAPREP 26ML APPLICATOR (WOUND CARE) ×2 IMPLANT
DURAPREP 6ML APPLICATOR 50/CS (WOUND CARE) ×2 IMPLANT
ELECT COATED BLADE 2.86 ST (ELECTRODE) ×2 IMPLANT
ELECTRODE REM PT RTRN 9FT ADLT (ELECTROSURGICAL) ×2 IMPLANT
GAUZE 4X4 16PLY ~~LOC~~+RFID DBL (SPONGE) ×2 IMPLANT
GAUZE SPONGE 4X4 12PLY STRL (GAUZE/BANDAGES/DRESSINGS) ×2 IMPLANT
GLOVE BIO SURGEON STRL SZ7 (GLOVE) ×4 IMPLANT
GLOVE BIO SURGEON STRL SZ8 (GLOVE) ×4 IMPLANT
GLOVE BIOGEL PI IND STRL 7.0 (GLOVE) ×4 IMPLANT
GOWN STRL REUS W/ TWL LRG LVL3 (GOWN DISPOSABLE) ×4 IMPLANT
GOWN STRL REUS W/ TWL XL LVL3 (GOWN DISPOSABLE) ×4 IMPLANT
GOWN STRL REUS W/TWL 2XL LVL3 (GOWN DISPOSABLE) IMPLANT
HEMOSTAT POWDER KIT SURGIFOAM (HEMOSTASIS) ×2 IMPLANT
KIT BASIN OR (CUSTOM PROCEDURE TRAY) ×4 IMPLANT
KIT TURNOVER KIT B (KITS) ×4 IMPLANT
NDL HYPO 25X1 1.5 SAFETY (NEEDLE) ×4 IMPLANT
NDL SPNL 20GX3.5 QUINCKE YW (NEEDLE) ×2 IMPLANT
NEEDLE HYPO 25X1 1.5 SAFETY (NEEDLE) ×4 IMPLANT
NEEDLE SPNL 20GX3.5 QUINCKE YW (NEEDLE) ×2 IMPLANT
PACK LAMINECTOMY NEURO (CUSTOM PROCEDURE TRAY) ×2 IMPLANT
PACK SRG BSC III STRL LF ECLPS (CUSTOM PROCEDURE TRAY) ×2 IMPLANT
PAD ARMBOARD POSITIONER FOAM (MISCELLANEOUS) ×4 IMPLANT
PIN DISTRACTION 14MM (PIN) ×4 IMPLANT
PLATE CONT ACP 15 3H (Plate) IMPLANT
PUTTY DBM 5CC (Putty) IMPLANT
SCREW SINGLE LEAD 3.5X16 ST (Screw) IMPLANT
SOLN 0.9% NACL 1000 ML (IV SOLUTION) ×4 IMPLANT
SOLN 0.9% NACL POUR BTL 1000ML (IV SOLUTION) ×4 IMPLANT
SOLN STERILE WATER 1000 ML (IV SOLUTION) ×4 IMPLANT
SOLN STERILE WATER BTL 1000 ML (IV SOLUTION) ×4 IMPLANT
SPACER IDENTITI 8X16X14 7D CRV (Spacer) IMPLANT
SPACER SPN IDENTITI 7X16X14 7D (Spacer) IMPLANT
SPONGE INTESTINAL PEANUT (DISPOSABLE) ×2 IMPLANT
SPONGE SURGIFOAM ABS GEL 100 (HEMOSTASIS) IMPLANT
STOCKINETTE 4X48 STRL (DRAPES) ×2 IMPLANT
STRIP CLOSURE SKIN 1/2X4 (GAUZE/BANDAGES/DRESSINGS) ×2 IMPLANT
SUT ETHILON 4 0 PS 2 18 (SUTURE) ×2 IMPLANT
SUT VIC AB 3-0 SH 8-18 (SUTURE) ×4 IMPLANT
SUT VIC AB 4-0 PS2 18 (SUTURE) IMPLANT
SYR BULB EAR ULCER 3OZ GRN STR (SYRINGE) ×2 IMPLANT
SYR CONTROL 10ML LL (SYRINGE) ×2 IMPLANT
TOWEL GREEN STERILE (TOWEL DISPOSABLE) ×4 IMPLANT
TOWEL GREEN STERILE FF (TOWEL DISPOSABLE) ×4 IMPLANT
TUBE CONNECTING 12X1/4 (SUCTIONS) ×2 IMPLANT
UNDERPAD 30X36 HEAVY ABSORB (UNDERPADS AND DIAPERS) ×2 IMPLANT

## 2024-05-08 NOTE — Anesthesia Postprocedure Evaluation (Signed)
 Anesthesia Post Note  Patient: Joseph Melton  Procedure(s) Performed: ANTERIOR CERVICAL DECOMPRESSION/DISCECTOMY FUSION CERVICAL THREE-SIX (Spine Cervical) CARPAL TUNNEL RELEASE (Left: Hand)     Patient location during evaluation: PACU Anesthesia Type: General Level of consciousness: awake and alert Pain management: pain level controlled Vital Signs Assessment: post-procedure vital signs reviewed and stable Respiratory status: spontaneous breathing, nonlabored ventilation, respiratory function stable and patient connected to nasal cannula oxygen Cardiovascular status: blood pressure returned to baseline and stable Postop Assessment: no apparent nausea or vomiting Anesthetic complications: no   There were no known notable events for this encounter.  Last Vitals:  Vitals:   05/08/24 1245 05/08/24 1310  BP: (!) 131/93 (!) 139/90  Pulse: 85 89  Resp: 18 18  Temp: 37.3 C 36.7 C  SpO2: 94% 94%    Last Pain:  Vitals:   05/08/24 1310  TempSrc: Oral  PainSc:                  Debbora Ang S

## 2024-05-08 NOTE — Anesthesia Procedure Notes (Addendum)
 Procedure Name: Intubation Date/Time: 05/08/2024 7:59 AM  Performed by: Hedy Jarred, CRNAPre-anesthesia Checklist: Patient identified, Emergency Drugs available, Suction available, Patient being monitored and Timeout performed Patient Re-evaluated:Patient Re-evaluated prior to induction Oxygen Delivery Method: Circle system utilized Preoxygenation: Pre-oxygenation with 100% oxygen Induction Type: IV induction Ventilation: Oral airway inserted - appropriate to patient size and Mask ventilation without difficulty Laryngoscope Size: Glidescope and 4 Grade View: Grade I Tube type: Subglottic suction tube Tube size: 8.0 mm Number of attempts: 1 Airway Equipment and Method: Stylet and Video-laryngoscopy Placement Confirmation: ETT inserted through vocal cords under direct vision, breath sounds checked- equal and bilateral and CO2 detector Secured at: 23 cm Tube secured with: Tape Dental Injury: Teeth and Oropharynx as per pre-operative assessment

## 2024-05-08 NOTE — Op Note (Addendum)
 05/08/2024  11:26 AM  PATIENT:  Joseph Melton  73 y.o. 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 Decompressive anterior cervical discectomy C3-4 C4-5 C5-6, 2. Anterior cervical arthrodesis C3-4 C4-5 C5-6 utilizing a porous titanium interbody cage packed with locally harvested morcellized autologous bone graft and DBM putty, 3. Anterior cervical plating C3-4 C4-5 and C5-6 utilizing ATEC single level plates at each level, 4.  Left carpal tunnel release at the wrist  SURGEON:  Alm Molt, MD  ASSISTANTS: Suzen Pean FNP  ANESTHESIA:   General  EBL: 150 ml  Total I/O In: 250 [IV Piggyback:250] Out: 150 [Blood:150]  BLOOD ADMINISTERED: none  DRAINS: none  SPECIMEN:  none  INDICATION FOR PROCEDURE: This patient presented with neck pain and right shoulder pain and left hand numbness. 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 The patient tried conservative measures without relief. Pain was debilitating. Recommended ACDF with plating and a left carpal tunnel release. Patient understood the risks, benefits, and alternatives and potential outcomes and wished to proceed.  PROCEDURE DETAILS: Patient was brought to the operating room placed under general endotracheal anesthesia. Patient was placed in the supine position on the operating room table.  The left arm was extended on an armboard and prepped circumferentially from the fingertips to the elbow with DuraPrep and then draped in the usual sterile fashion.  5 cc of local anesthesia was injected and a palmar incision was made from the distal wrist crease into the palm in line with the webspace between the middle and fourth fingers.  We dissected down through  the palmar fascia and identified the transverse carpal ligament which was opened with a 15 blade scalpel.  We then spread between the ligament and the nerve with a hemostat and completely transected the ligament both proximally and distally until the nerve was free.  We then palpated both proximally and distally to assure adequate decompression of the nerve.  We irrigated with saline solution.  We dried all bleeding points.  We closed the palmar fascia with a single 3-0 Vicryl in the subcuticular tissue with interrupted 3-0 Vicryl's.  The skin was closed with interrupted 4-0 Ethilon vertical mattress sutures.  The hand was then cleaned and then wrapped in a Kerlix and an Ace bandage and the patient was then positioned for ACDF.    The neck was prepped with Duraprep and draped in a sterile fashion.   Three cc of local anesthesia was injected and a transverse incision was made on the right side of the neck.  Dissection was carried down thru the subcutaneous tissue and the platysma was  elevated, opened, and undermined with Metzenbaum scissors.  Dissection was then carried out thru an avascular plane leaving the sternocleidomastoid carotid artery and jugular vein laterally and the trachea and esophagus medially with the assistance of my nurse practitioner. The ventral aspect of the vertebral column was identified and a localizing x-ray was taken. The C4-5 level was identified and all in the room agreed with the level. The longus colli muscles were then elevated and the retractor was placed with the assistance of my nurse practitioner to expose C3-4 C4-5 and C5-6. The annulus was incised and the disc space entered. Discectomy was performed with micro-curettes and pituitary rongeurs. I then  used the high-speed drill to drill the endplates down to the level of the posterior longitudinal ligament. The drill shavings were saved in a mucous trap for later arthrodesis. The operating microscope was draped and brought into  the field provided additional magnification, illumination and visualization. Discectomy was continued posteriorly thru the disc space. Posterior longitudinal ligament was opened with a nerve hook, and then removed along with disc herniation and osteophytes, decompressing the spinal canal and thecal sac. We then continued to remove osteophytic overgrowth and disc material decompressing the neural foramina and exiting nerve roots bilaterally. The scope was angled up and down to help decompress and undercut the vertebral bodies. Once the decompression was completed we could pass a nerve hook circumferentially to assure adequate decompression in the midline and in the neural foramina. So by both visualization and palpation we felt we had an adequate decompression of the neural elements. We then measured the height of the intravertebral disc space and selected a 8 millimeter force titanium interbody cage packed with autograft and DBM putty for each level. It was then gently positioned in the intravertebral disc space(s) and countersunk. I then used a 15 mm ATEC plate at each level and placed 16 mm variable angle screws into the vertebral bodies of each level and locked them into position. The wound was irrigated with bacitracin solution, checked for hemostasis which was established and confirmed. Once meticulous hemostasis was achieved, we then proceeded with closure with the assistance of my nurse practitioner. The platysma was closed with interrupted 3-0 undyed Vicryl suture, the subcuticular layer was closed with interrupted 3-0 undyed Vicryl suture. The skin edges were approximated with steristrips. The drapes were removed. A sterile dressing was applied. The patient was then awakened from general anesthesia and transferred to the recovery room in stable condition. At the end of the procedure all sponge, needle and instrument counts were correct.   PLAN OF CARE: Admit to inpatient   PATIENT DISPOSITION:  PACU -  hemodynamically stable.   Delay start of Pharmacological VTE agent (>24hrs) due to surgical blood loss or risk of bleeding:  yes

## 2024-05-08 NOTE — Transfer of Care (Signed)
 Immediate Anesthesia Transfer of Care Note  Patient: Joseph Melton  Procedure(s) Performed: ANTERIOR CERVICAL DECOMPRESSION/DISCECTOMY FUSION CERVICAL THREE-SIX (Spine Cervical) CARPAL TUNNEL RELEASE (Left: Hand)  Patient Location: PACU  Anesthesia Type:General  Level of Consciousness: awake and alert   Airway & Oxygen Therapy: Patient Spontanous Breathing and Patient connected to face mask oxygen  Post-op Assessment: Report given to RN and Post -op Vital signs reviewed and stable  Post vital signs: Reviewed and stable  Last Vitals:  Vitals Value Taken Time  BP 138/96 05/08/24 11:30  Temp 36.9 C 05/08/24 11:23  Pulse 78 05/08/24 11:39  Resp 13 05/08/24 11:39  SpO2 88 % 05/08/24 11:39  Vitals shown include unfiled device data.  Last Pain:  Vitals:   05/08/24 1136  TempSrc:   PainSc: 6       Patients Stated Pain Goal: 2 (05/08/24 9376)  Complications: No notable events documented.

## 2024-05-08 NOTE — H&P (Signed)
 Subjective:   Patient is a 73 y.o. male admitted for cervical stenosis and L CTS. The patient first presented to me with complaints of neck pain, arm pain, and numbness of the arm(s). Onset of symptoms was several months ago. The pain is described as aching and occurs all day. The pain is rated severe, and is located in the neck and radiates to the RUE. The symptoms have been progressive. Symptoms are exacerbated by extending head backwards, and are relieved by none.  Previous work up includes MRI of cervical spine, results: spinal stenosis.  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 Janit - tendon became detattched    No Known Allergies  Social History   Tobacco Use   Smoking status: Former    Current packs/day: 0.00    Types: Cigarettes    Quit date: 06/22/2013    Years since quitting: 10.8   Smokeless tobacco: Never  Substance Use Topics   Alcohol use: Yes    Comment: twice a week    Family History  Problem Relation Age of Onset   Cancer Mother    Dementia Mother    Melanoma Father    Cancer Sister    Prior to Admission medications   Medication Sig Start Date End Date Taking? Authorizing Provider  acetaminophen  (TYLENOL ) 325 MG tablet Take 1,000 mg by mouth every 6 (six) hours as needed for mild pain (pain score 1-3).   Yes  [provider]  docusate sodium (COLACE) 100 MG capsule Take 100 mg by mouth daily as needed for mild constipation.   Yes [provider]  ibuprofen  (ADVIL ) 200 MG tablet Take 800 mg by mouth every 8 (eight) hours.   Yes [provider]  traMADol (ULTRAM) 50 MG tablet Take 50 mg by mouth every 12 (twelve) hours as needed for moderate pain (pain score 4-6) or severe pain (pain score 7-10).   Yes [provider]     Review of Systems  Positive ROS: neg  All other systems have been reviewed and were otherwise negative with the exception of those mentioned in the HPI and as above.  Objective: Vital signs in last 24 hours: Temp:  [98.4 F (36.9 C)] 98.4 F (36.9 C) (10/09 9391) Pulse Rate:  [73] 73 (10/09 0608) Resp:  [20] 20 (10/09 9391) BP: (129)/(90) 129/90 (10/09 0608) SpO2:  [95 %] 95 % (10/09 9391) Weight:  [90.7 kg] 90.7 kg (10/09 9391)  General Appearance: Alert, cooperative, no distress, appears stated age Head: Normocephalic, without obvious abnormality, atraumatic Eyes: PERRL, conjunctiva/corneas clear, EOM's intact      Neck: Supple, symmetrical, trachea midline, Back: Symmetric, no curvature, ROM normal, no CVA tenderness Lungs:  respirations unlabored Heart:  Regular rate and rhythm Abdomen: Soft, non-tender Extremities: Extremities normal, atraumatic, no cyanosis or edema Pulses: 2+ and symmetric all extremities Skin: Skin color, texture, turgor normal, no rashes or lesions  NEUROLOGIC:  Mental status: Alert and oriented x4, no aphasia, good attention span, fund of knowledge and memory  Motor Exam - grossly normal Sensory Exam - grossly normal Reflexes: 1+ Coordination - grossly normal Gait - grossly normal Balance - grossly normal Cranial Nerves: I: smell Not tested  II: visual acuity  OS: nl    OD: nl  II: visual fields Full to confrontation  II: pupils Equal, round, reactive to light  III,VII: ptosis None  III,IV,VI:  extraocular muscles  Full ROM  V: mastication Normal  V: facial light touch sensation  Normal  V,VII: corneal reflex  Present  VII: facial muscle function - upper  Normal  VII: facial muscle function - lower Normal  VIII: hearing Not tested  IX: soft palate elevation  Normal  IX,X: gag reflex Present  XI: trapezius strength  5/5  XI: sternocleidomastoid strength 5/5  XI: neck flexion strength  5/5  XII: tongue strength  Normal    Data Review Lab Results  Component Value Date   WBC 8.1 04/25/2024   HGB 14.8 04/25/2024   HCT 44.8 04/25/2024   MCV 92.2 04/25/2024   PLT 228 04/25/2024   Lab Results  Component Value Date   NA 137 04/25/2024   K 3.8 04/25/2024   CL 105 04/25/2024   CO2 23 04/25/2024   BUN 23 04/25/2024   CREATININE 1.15 04/25/2024   GLUCOSE 101 (H) 04/25/2024   Lab Results  Component Value Date   INR 0.9 04/16/2008    Assessment:   Cervical neck pain with herniated nucleus pulposus/ spondylosis/ stenosis at C3-4 C4-5 C5-6 and L CTS. Estimated body mass index is 28.7 kg/m as calculated from the following:   Height as of this encounter: 5' 10 (1.778 m).   Weight as of this encounter: 90.7 kg.  Patient has failed conservative therapy. Planned surgery : ACDF C3-4 C4-5 C5-6 and L CTR  Plan:   I explained the condition and procedure to the patient and answered any questions.  Patient wishes to proceed with procedure as planned. Understands risks/ benefits/ and expected or typical outcomes.  Alm GORMAN Molt 05/08/2024 7:17 AM

## 2024-05-08 NOTE — Plan of Care (Signed)

## 2024-05-08 NOTE — Evaluation (Signed)
 Occupational Therapy Evaluation & Discharge Patient Details Name: Joseph Melton MRN: [id number] DOB: Jan 08, 1951 Today's Date: 05/08/2024   History of Present Illness   Pt is a 73 y.o male admitted 10/9 for scheduled ACDF C3-C6 & L carpal tunnel release. PMH: diverticulitis, PVD     Clinical Impressions Pt admitted based on above, and was seen based on problem list below. PTA pt was independent with ADLs and IADLs Today pt is supervision to mod I with ADLs with use of compensatory strategies for ADLs. Provided and reviewed cervical educational handout with pt and spouse; pt able to verbalize and demo return understanding. Began educating pt on sensory re-education strategies for LUE, and encouraged OP OT follow up to improve [facility] and strength. All education complete, no further acute OT needs, OT is signing off on this pt.      If plan is discharge home, recommend the following:   Assistance with cooking/housework     Functional Status Assessment   Patient has had a recent decline in their functional status and demonstrates the ability to make significant improvements in function in a reasonable and predictable amount of time.     Equipment Recommendations   None recommended by OT      Precautions/Restrictions   Precautions Precautions: Cervical Precaution Booklet Issued: Yes (comment) Recall of Precautions/Restrictions: Intact Precaution/Restrictions Comments: No brace needed Restrictions Weight Bearing Restrictions Per Provider Order: Yes LUE Weight Bearing Per Provider Order: Weight bearing as tolerated     Mobility Bed Mobility Overal bed mobility: Modified Independent     General bed mobility comments: Cues for log roll, no assist    Transfers Overall transfer level: Modified independent Equipment used: None       General transfer comment: Ambulated ~268ft no AD or LOB      Balance Overall balance assessment: Mild deficits observed, not formally  tested       ADL either performed or assessed with clinical judgement   ADL Overall ADL's : Modified independent     General ADL Comments: Educated on use of compensatory strategies, mod I with increased time     Vision Baseline Vision/History: 0 No visual deficits Patient Visual Report: No change from baseline Vision Assessment?: No apparent visual deficits            Pertinent Vitals/Pain Pain Assessment Pain Assessment: Faces Faces Pain Scale: Hurts little more Pain Location: surgical site Pain Descriptors / Indicators: Discomfort Pain Intervention(s): Monitored during session     Extremity/Trunk Assessment Upper Extremity Assessment Upper Extremity Assessment: LUE deficits/detail;RUE deficits/detail RUE Deficits / Details: History of wrist sx, decreased digit flex and grip strength RUE Sensation: decreased light touch RUE Coordination: decreased fine motor LUE Deficits / Details: decreased sensation medially, full digit AROM LUE Sensation: decreased light touch LUE Coordination: decreased fine motor   Lower Extremity Assessment Lower Extremity Assessment: Overall WFL for tasks assessed   Cervical / Trunk Assessment Cervical / Trunk Assessment: Neck Surgery   Communication Communication Communication: No apparent difficulties   Cognition Arousal: Alert Behavior During Therapy: WFL for tasks assessed/performed Cognition: No apparent impairments   Following commands: Intact       Cueing  General Comments   Cueing Techniques: Verbal cues  Wife present and supportive           Home Living Family/patient expects to be discharged to:: Private residence Living Arrangements: Spouse/significant other Available Help at Discharge: Family Type of Home: House Home Access: Level entry     Home Layout:  One level     Bathroom Shower/Tub: Producer, television/film/video: Standard Bathroom Accessibility: Yes How Accessible: Accessible via walker Home  Equipment: Rolling Walker (2 wheels);Cane - single point;Shower seat          Prior Functioning/Environment Prior Level of Function : Independent/Modified Independent             Mobility Comments: No AD ADLs Comments: Working and driving    OT Problem List: Decreased strength;Decreased range of motion;Impaired UE functional use        OT Goals(Current goals can be found in the care plan section)   Acute Rehab OT Goals Patient Stated Goal: To go home OT Goal Formulation: With patient Time For Goal Achievement: 05/22/24 Potential to Achieve Goals: Good   AM-PAC OT 6 Clicks Daily Activity     Outcome Measure Help from another person eating meals?: None Help from another person taking care of personal grooming?: None Help from another person toileting, which includes using toliet, bedpan, or urinal?: None Help from another person bathing (including washing, rinsing, drying)?: None Help from another person to put on and taking off regular upper body clothing?: None Help from another person to put on and taking off regular lower body clothing?: None 6 Click Score: 24   End of Session Nurse Communication: Mobility status  Activity Tolerance: Patient tolerated treatment well Patient left: in bed;with call bell/phone within reach;with family/visitor present  OT Visit Diagnosis: Muscle weakness (generalized) (M62.81)                Time: 8462-8397 OT Time Calculation (min): 25 min Charges:  OT General Charges $OT Visit: 1 Visit OT Evaluation $OT Eval Moderate Complexity: 1 Mod  Mayra Jolliffe C, OT  Acute Rehabilitation Services Office [phone] Secure chat preferred   [name] 05/08/2024, 4:13 PM

## 2024-05-09 NOTE — Progress Notes (Signed)

## 2024-05-09 NOTE — Discharge Summary (Signed)
 Physician Discharge Summary  Patient ID: Joseph Melton MRN: [id number] DOB/AGE: 74/01/1951 74 y.o.  Admit date: 05/08/2024 Discharge date: 05/09/2024  Admission Diagnoses: cervical stenosis and L CTS    Discharge Diagnoses: same   Discharged Condition: good  Hospital Course: The patient was admitted on 05/08/2024 and taken to the operating room where the patient underwent ACDF and L ctr. The patient tolerated the procedure well and was taken to the recovery room and then to the floor in stable condition. The hospital course was routine. There were no complications. The wound remained clean dry and intact. Pt had appropriate neck soreness. No complaints of arm pain or new N/T/W. The patient remained afebrile with stable vital signs, and tolerated a regular diet. The patient continued to increase activities, and pain was well controlled with oral pain medications.   Consults: None  Significant Diagnostic Studies:  Results for orders placed or performed during the hospital encounter of 05/08/24  ABO/Rh   Collection Time: 05/08/24  7:11 AM  Result Value Ref Range   ABO/RH(D)      A POS Performed at [facility], [address], [place], [place] [address]     DG Cervical Spine 2 or 3 views Result Date: 05/08/2024 CLINICAL DATA:  Elective surgery. EXAM: CERVICAL SPINE - 2-3 VIEW COMPARISON:  MRI 03/26/2024 FINDINGS: Five fluoroscopic spot view of the cervical spine submitted from the operating room. Anterior fusion at C3-C4, C4-C5, and C5-C6 with interbody spacers. Fluoroscopy time 43.2 seconds. Dose 11.61 mGy. IMPRESSION: Intraoperative fluoroscopy during cervical fusion. Electronically Signed   By: Andrea Gasman M.D.   On: 05/08/2024 11:46   DG C-Arm 1-60 Min-No Report Result Date: 05/08/2024 Fluoroscopy was utilized by the requesting physician.  No radiographic interpretation.   DG C-Arm 1-60 Min-No Report Result Date: 05/08/2024 Fluoroscopy was utilized by the requesting  physician.  No radiographic interpretation.   DG C-Arm 1-60 Min-No Report Result Date: 05/08/2024 Fluoroscopy was utilized by the requesting physician.  No radiographic interpretation.    Antibiotics:  Anti-infectives (From admission, onward)    Start     Dose/Rate Route Frequency Ordered Stop   05/08/24 1400  ceFAZolin  (ANCEF ) IVPB 2g/100 mL premix        2 g 200 mL/hr over 30 Minutes Intravenous Every 8 hours 05/08/24 1242 05/08/24 2343   05/08/24 0615  ceFAZolin  (ANCEF ) IVPB 2g/100 mL premix        2 g 200 mL/hr over 30 Minutes Intravenous On call to O.R. 05/08/24 9392 05/08/24 0806       Discharge Exam: Blood pressure (!) 141/81, pulse 88, temperature 97.8 F (36.6 C), temperature source Oral, resp. rate 19, height 5' 10 (1.778 m), weight 90.7 kg, SpO2 97%. Neurologic: Grossly normal Dressing dry  Discharge Medications:   Allergies as of 05/09/2024   No Known Allergies      Medication List     STOP taking these medications    acetaminophen  325 MG tablet Commonly known as: TYLENOL    ibuprofen  200 MG tablet Commonly known as: ADVIL    traMADol 50 MG tablet Commonly known as: ULTRAM       TAKE these medications    docusate sodium 100 MG capsule Commonly known as: COLACE Take 100 mg by mouth daily as needed for mild constipation.        Disposition: home   Final Dx: ACDF C3-4 C4-5 C5-6 and L CTR  Discharge Instructions      Remove dressing in 72 hours  Complete by: As directed    Call MD for:  difficulty breathing, headache or visual disturbances   Complete by: As directed    Call MD for:  persistant nausea and vomiting   Complete by: As directed    Call MD for:  redness, tenderness, or signs of infection (pain, swelling, redness, odor or green/yellow discharge around incision site)   Complete by: As directed    Call MD for:  severe uncontrolled pain   Complete by: As directed    Call MD for:  temperature >100.4   Complete by: As directed     Diet - low sodium heart healthy   Complete by: As directed    Increase activity slowly   Complete by: As directed         Follow-up Information     Joshua Alm Hamilton, MD. Call.   Specialty: Neurosurgery Why: As needed, If symptoms worsen Contact information: [address] [place] [address] [phone]                  Signed: Alm GORMAN Joshua 05/09/2024, 11:08 AM

## 2024-05-09 NOTE — Discharge Instructions (Signed)
  Wound Care Keep incision covered and dry until post op day 3. You may remove the Honeycomb dressing on post op day 3. Leave steri-strips on neck.  They will fall off by themselves. Do not put any creams, lotions, or ointments on incision. You are fine to shower. Let water  run over incision and pat dry.  Activity Walk each and every day, increasing distance each day. No lifting greater than 8 lbs.  Avoid excessive neck motion. No driving, or riding a car unless coming back and forth to see the doctor  Diet Resume your normal diet.    Call Your Doctor If Any of These Occur Redness, drainage, or swelling at the wound.  Temperature greater than 101 degrees. Severe pain not relieved by pain medication. Incision starts to come apart.  Follow Up Appt Call [phone] if you have one or any problem.

## 2024-05-12 ENCOUNTER — Encounter (HOSPITAL_COMMUNITY): Payer: Self-pay | Admitting: Neurological Surgery

## 2024-05-22 DIAGNOSIS — Z6830 Body mass index (BMI) 30.0-30.9, adult: Secondary | ICD-10-CM | POA: Diagnosis not present

## 2024-05-22 DIAGNOSIS — M542 Cervicalgia: Secondary | ICD-10-CM | POA: Diagnosis not present

## 2024-05-22 DIAGNOSIS — M5412 Radiculopathy, cervical region: Secondary | ICD-10-CM | POA: Diagnosis not present

## 2024-06-04 DIAGNOSIS — G629 Polyneuropathy, unspecified: Secondary | ICD-10-CM | POA: Diagnosis not present

## 2024-06-04 DIAGNOSIS — Z833 Family history of diabetes mellitus: Secondary | ICD-10-CM | POA: Diagnosis not present

## 2024-06-04 DIAGNOSIS — E669 Obesity, unspecified: Secondary | ICD-10-CM | POA: Diagnosis not present

## 2024-06-04 DIAGNOSIS — Z87891 Personal history of nicotine dependence: Secondary | ICD-10-CM | POA: Diagnosis not present

## 2024-06-04 DIAGNOSIS — Z6831 Body mass index (BMI) 31.0-31.9, adult: Secondary | ICD-10-CM | POA: Diagnosis not present

## 2024-06-04 DIAGNOSIS — N182 Chronic kidney disease, stage 2 (mild): Secondary | ICD-10-CM | POA: Diagnosis not present

## 2024-06-04 DIAGNOSIS — H9192 Unspecified hearing loss, left ear: Secondary | ICD-10-CM | POA: Diagnosis not present
